# Patient Record
Sex: Female | Born: 1990 | Race: White | Hispanic: No | Marital: Married | Smoking: Never smoker
Health system: Southern US, Community
[De-identification: ages and names within clinical notes are randomized; demographics above are authoritative.]

## PROBLEM LIST (undated history)

## (undated) DIAGNOSIS — O99280 Endocrine, nutritional and metabolic diseases complicating pregnancy, unspecified trimester: Secondary | ICD-10-CM

## (undated) DIAGNOSIS — Z789 Other specified health status: Secondary | ICD-10-CM

## (undated) DIAGNOSIS — D219 Benign neoplasm of connective and other soft tissue, unspecified: Secondary | ICD-10-CM

## (undated) DIAGNOSIS — E7212 Methylenetetrahydrofolate reductase deficiency: Secondary | ICD-10-CM

---
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
## 2009-01-09 ENCOUNTER — Encounter: Payer: Self-pay | Admitting: Cardiovascular Disease

## 2009-07-03 ENCOUNTER — Encounter: Payer: Self-pay | Admitting: Cardiovascular Disease

## 2011-07-15 HISTORY — PX: DILATION AND CURETTAGE OF UTERUS: SHX78

## 2011-07-15 NOTE — L&D Delivery Note (Signed)
Delivery Note At 11:09 AM a non-viable female was delivered via Vaginal, Spontaneous Delivery (Presentation: Left Occiput Anterior).    Placenta status: Intact, Manual removal.  Cord: 2 vessels with the following complications: tight nuchal cord x 2 with a true knot in the cord and a loop of cord wrapped around the right arm. I believe the cause of this IUFD was secondary to the cord and was a cord accident  Anesthesia:  epidural Episiotomy: none Lacerations: none Suture Repair: not necessary Est. Blood Loss (mL): 100  Mom to to stay here for recovery.  Baby to NA.  Kaylise Blakeley L 12/24/2011, 11:24 AM

## 2011-08-05 LAB — OB RESULTS CONSOLE HIV ANTIBODY (ROUTINE TESTING): HIV: NONREACTIVE

## 2011-08-05 LAB — OB RESULTS CONSOLE GC/CHLAMYDIA: Gonorrhea: NEGATIVE

## 2011-08-05 LAB — OB RESULTS CONSOLE ABO/RH

## 2011-08-05 LAB — OB RESULTS CONSOLE HEPATITIS B SURFACE ANTIGEN: Hepatitis B Surface Ag: NEGATIVE

## 2011-08-05 LAB — OB RESULTS CONSOLE RUBELLA ANTIBODY, IGM: Rubella: IMMUNE

## 2011-09-23 ENCOUNTER — Other Ambulatory Visit: Payer: Self-pay

## 2011-09-26 ENCOUNTER — Other Ambulatory Visit (HOSPITAL_COMMUNITY): Payer: Self-pay | Admitting: Obstetrics and Gynecology

## 2011-09-26 DIAGNOSIS — O2690 Pregnancy related conditions, unspecified, unspecified trimester: Secondary | ICD-10-CM

## 2011-09-26 DIAGNOSIS — Z3689 Encounter for other specified antenatal screening: Secondary | ICD-10-CM

## 2011-09-30 ENCOUNTER — Ambulatory Visit (HOSPITAL_COMMUNITY)
Admission: RE | Admit: 2011-09-30 | Discharge: 2011-09-30 | Disposition: A | Discharge status: Home / Self Care | Payer: BC Managed Care – PPO | Source: Ambulatory Visit | Attending: Obstetrics and Gynecology | Admitting: Obstetrics and Gynecology

## 2011-09-30 DIAGNOSIS — IMO0001 Reserved for inherently not codable concepts without codable children: Secondary | ICD-10-CM | POA: Insufficient documentation

## 2011-09-30 DIAGNOSIS — Z363 Encounter for antenatal screening for malformations: Secondary | ICD-10-CM | POA: Insufficient documentation

## 2011-09-30 DIAGNOSIS — Z1389 Encounter for screening for other disorder: Secondary | ICD-10-CM | POA: Insufficient documentation

## 2011-09-30 DIAGNOSIS — O2690 Pregnancy related conditions, unspecified, unspecified trimester: Secondary | ICD-10-CM

## 2011-09-30 DIAGNOSIS — O358XX0 Maternal care for other (suspected) fetal abnormality and damage, not applicable or unspecified: Secondary | ICD-10-CM | POA: Insufficient documentation

## 2011-09-30 DIAGNOSIS — Z3689 Encounter for other specified antenatal screening: Secondary | ICD-10-CM

## 2011-12-23 ENCOUNTER — Inpatient Hospital Stay (HOSPITAL_COMMUNITY)
Admission: AD | Admit: 2011-12-23 | Discharge: 2011-12-24 | DRG: 372 | Disposition: A | Discharge status: Home / Self Care | Payer: BC Managed Care – PPO | Source: Ambulatory Visit | Attending: Obstetrics and Gynecology | Admitting: Obstetrics and Gynecology

## 2011-12-23 ENCOUNTER — Encounter (HOSPITAL_COMMUNITY): Payer: Self-pay | Admitting: *Deleted

## 2011-12-23 DIAGNOSIS — O364XX0 Maternal care for intrauterine death, not applicable or unspecified: Principal | ICD-10-CM | POA: Diagnosis present

## 2011-12-23 HISTORY — DX: Other specified health status: Z78.9

## 2011-12-23 LAB — CBC
HCT: 31.5 % — ABNORMAL LOW (ref 36.0–46.0)
Hemoglobin: 11 g/dL — ABNORMAL LOW (ref 12.0–15.0)
MCH: 30.1 pg (ref 26.0–34.0)
MCHC: 34.9 g/dL (ref 30.0–36.0)
RDW: 13.3 % (ref 11.5–15.5)

## 2011-12-23 MED ORDER — ONDANSETRON HCL 4 MG/2ML IJ SOLN
4.0000 mg | Freq: Four times a day (QID) | INTRAMUSCULAR | Status: DC | PRN
  Administered 2011-12-24: 4 mg via INTRAVENOUS
  Filled 2011-12-23: qty 2

## 2011-12-23 MED ORDER — FENTANYL 2.5 MCG/ML BUPIVACAINE 1/10 % EPIDURAL INFUSION (WH - ANES)
14.0000 mL/h | INTRAMUSCULAR | Status: DC
  Administered 2011-12-24 (×2): 14 mL/h via EPIDURAL
  Filled 2011-12-23 (×3): qty 60

## 2011-12-23 MED ORDER — OXYTOCIN BOLUS FROM INFUSION
500.0000 mL | Freq: Once | INTRAVENOUS | Status: DC
  Filled 2011-12-23: qty 500
  Filled 2011-12-23: qty 1000

## 2011-12-23 MED ORDER — EPHEDRINE 5 MG/ML INJ: 10.0000 mg | INTRAVENOUS | Status: DC | PRN

## 2011-12-23 MED ORDER — MISOPROSTOL 200 MCG PO TABS
200.0000 ug | ORAL_TABLET | Freq: Four times a day (QID) | ORAL | Status: DC | PRN
  Administered 2011-12-23 – 2011-12-24 (×2): 200 ug via VAGINAL
  Filled 2011-12-23 (×2): qty 1

## 2011-12-23 MED ORDER — LACTATED RINGERS IV SOLN: 500.0000 mL | INTRAVENOUS | Status: DC | PRN

## 2011-12-23 MED ORDER — BUTORPHANOL TARTRATE 2 MG/ML IJ SOLN
1.0000 mg | INTRAMUSCULAR | Status: DC | PRN
  Administered 2011-12-23 – 2011-12-24 (×3): 1 mg via INTRAVENOUS
  Filled 2011-12-23 (×4): qty 1

## 2011-12-23 MED ORDER — IBUPROFEN 600 MG PO TABS
600.0000 mg | ORAL_TABLET | Freq: Four times a day (QID) | ORAL | Status: DC | PRN
  Filled 2011-12-23: qty 1

## 2011-12-23 MED ORDER — LIDOCAINE HCL (PF) 1 % IJ SOLN: 30.0000 mL | INTRAMUSCULAR | Status: DC | PRN

## 2011-12-23 MED ORDER — LACTATED RINGERS IV SOLN
500.0000 mL | Freq: Once | INTRAVENOUS | Status: AC
  Administered 2011-12-24: 500 mL via INTRAVENOUS

## 2011-12-23 MED ORDER — DIPHENHYDRAMINE HCL 50 MG/ML IJ SOLN: 12.5000 mg | INTRAMUSCULAR | Status: DC | PRN

## 2011-12-23 MED ORDER — CITRIC ACID-SODIUM CITRATE 334-500 MG/5ML PO SOLN: 30.0000 mL | ORAL | Status: DC | PRN

## 2011-12-23 MED ORDER — PHENYLEPHRINE 40 MCG/ML (10ML) SYRINGE FOR IV PUSH (FOR BLOOD PRESSURE SUPPORT)
80.0000 ug | PREFILLED_SYRINGE | INTRAVENOUS | Status: DC | PRN
  Filled 2011-12-23: qty 5

## 2011-12-23 MED ORDER — PHENYLEPHRINE 40 MCG/ML (10ML) SYRINGE FOR IV PUSH (FOR BLOOD PRESSURE SUPPORT)
80.0000 ug | PREFILLED_SYRINGE | INTRAVENOUS | Status: DC | PRN
Start: 2011-12-23 — End: 2011-12-24

## 2011-12-23 MED ORDER — ACETAMINOPHEN 325 MG PO TABS: 650.0000 mg | ORAL_TABLET | ORAL | Status: DC | PRN

## 2011-12-23 MED ORDER — OXYTOCIN 20 UNITS IN LACTATED RINGERS INFUSION - SIMPLE
125.0000 mL/h | Freq: Once | INTRAVENOUS | Status: DC
  Administered 2011-12-24: 500 mL/h via INTRAVENOUS

## 2011-12-23 MED ORDER — EPHEDRINE 5 MG/ML INJ
10.0000 mg | INTRAVENOUS | Status: DC | PRN
  Filled 2011-12-23: qty 4

## 2011-12-23 MED ORDER — OXYCODONE-ACETAMINOPHEN 5-325 MG PO TABS
1.0000 | ORAL_TABLET | ORAL | Status: DC | PRN
  Administered 2011-12-24: 1 via ORAL
  Filled 2011-12-23 (×2): qty 1

## 2011-12-23 MED ORDER — FLEET ENEMA 7-19 GM/118ML RE ENEM: 1.0000 | ENEMA | RECTAL | Status: DC | PRN

## 2011-12-23 MED ORDER — LACTATED RINGERS IV SOLN
INTRAVENOUS | Status: DC
  Administered 2011-12-23: 125 mL/h via INTRAVENOUS
  Administered 2011-12-24: 09:00:00 via INTRAVENOUS

## 2011-12-23 NOTE — H&P (Signed)
Chloe Chavez is a 21 y.o. female at 37 weeks with intrauterine fetal demise. He has a 2 vessel cord. Ultrasounds of otherwise been unremarkable. 2 seen the office on June 10 with 3 decreased fetal activity. No fetal heart tones were noted. Ultrasound confirmed intrauterine fetal demise. He should submit for Cytotec induction for intrauterine fetal demise. Ultrasound is performed in labor and delivery. No fetal heart activity is noted. Maternal Medical History:  Prenatal Complications - Diabetes: none.    OB History    Grav Para Term Preterm Abortions TAB SAB Ect Mult Living   2 1 1  0 0 0 0 0 0 1     Past Medical History  Diagnosis Date  . No pertinent past medical history    Past Surgical History  Procedure Date  . Breast surgery 2006    Cyst removal right breat   Family History: family history is not on file. Social History:  reports that she has never smoked. She has never used smokeless tobacco. She reports that she does not drink alcohol or use illicit drugs.  Review of Systems  All other systems reviewed and are negative.      Blood pressure 107/66, pulse 99, temperature 98.3 F (36.8 C), temperature source Oral, height 5\' 4"  (1.626 m), weight 72.122 kg (159 lb). Maternal Exam:  Abdomen: Fundal height is c/w dates.   Fetal presentation: vertex  Pelvis: adequate for delivery.   Cervix: Cervix evaluated by digital exam.     Physical Exam  Cardiovascular: Normal rate, regular rhythm and normal heart sounds.   Respiratory: Effort normal and breath sounds normal.  GI:       Gravid uterus neg FHT  Neurological: She has normal reflexes.    Prenatal labs: ABO, Rh: O/Positive/-- (01/22 0000) Antibody: Negative (01/22 0000) Rubella: Immune (01/22 0000) RPR: Nonreactive (01/22 0000)  HBsAg: Negative (01/22 0000)  HIV: Non-reactive (01/22 0000)  GBS: Positive (01/25 0000)   Assessment/Plan: Demise at 31 weeks. 2 vessel cord.  Patient undergo Cytotec induction  for intrauterine fetal demise. Ashby Dawes of the process risk and benefits have been discussed. All questions are answered.  Chloe Chavez 12/23/2011, 7:44 PM

## 2011-12-24 ENCOUNTER — Encounter (HOSPITAL_COMMUNITY): Payer: Self-pay | Admitting: Anesthesiology

## 2011-12-24 ENCOUNTER — Encounter (HOSPITAL_COMMUNITY): Payer: Self-pay | Admitting: *Deleted

## 2011-12-24 ENCOUNTER — Inpatient Hospital Stay (HOSPITAL_COMMUNITY): Payer: BC Managed Care – PPO | Admitting: Anesthesiology

## 2011-12-24 MED ORDER — MEASLES, MUMPS & RUBELLA VAC ~~LOC~~ INJ
0.5000 mL | INJECTION | Freq: Once | SUBCUTANEOUS | Status: DC
  Filled 2011-12-24: qty 0.5

## 2011-12-24 MED ORDER — WITCH HAZEL-GLYCERIN EX PADS: 1.0000 "application " | MEDICATED_PAD | CUTANEOUS | Status: DC | PRN

## 2011-12-24 MED ORDER — FENTANYL 2.5 MCG/ML BUPIVACAINE 1/10 % EPIDURAL INFUSION (WH - ANES)
INTRAMUSCULAR | Status: DC | PRN
  Administered 2011-12-24: 14 mL/h via EPIDURAL

## 2011-12-24 MED ORDER — IBUPROFEN 600 MG PO TABS: 600.0000 mg | ORAL_TABLET | Freq: Four times a day (QID) | ORAL | Status: AC | PRN

## 2011-12-24 MED ORDER — OXYCODONE-ACETAMINOPHEN 5-325 MG PO TABS: 1.0000 | ORAL_TABLET | ORAL | Status: AC | PRN

## 2011-12-24 MED ORDER — IBUPROFEN 600 MG PO TABS
600.0000 mg | ORAL_TABLET | Freq: Four times a day (QID) | ORAL | Status: DC
  Administered 2011-12-24: 600 mg via ORAL

## 2011-12-24 MED ORDER — MEDROXYPROGESTERONE ACETATE 150 MG/ML IM SUSP: 150.0000 mg | INTRAMUSCULAR | Status: DC | PRN

## 2011-12-24 MED ORDER — FLEET ENEMA 7-19 GM/118ML RE ENEM: 1.0000 | ENEMA | Freq: Every day | RECTAL | Status: DC | PRN

## 2011-12-24 MED ORDER — ONDANSETRON HCL 4 MG/2ML IJ SOLN: 4.0000 mg | INTRAMUSCULAR | Status: DC | PRN

## 2011-12-24 MED ORDER — TETANUS-DIPHTH-ACELL PERTUSSIS 5-2.5-18.5 LF-MCG/0.5 IM SUSP
0.5000 mL | Freq: Once | INTRAMUSCULAR | Status: DC
  Filled 2011-12-24: qty 0.5

## 2011-12-24 MED ORDER — OXYCODONE-ACETAMINOPHEN 5-325 MG PO TABS: 1.0000 | ORAL_TABLET | ORAL | Status: DC | PRN

## 2011-12-24 MED ORDER — LIDOCAINE HCL (PF) 1 % IJ SOLN
INTRAMUSCULAR | Status: DC | PRN
  Administered 2011-12-24 (×2): 8 mL

## 2011-12-24 MED ORDER — BISACODYL 10 MG RE SUPP
10.0000 mg | Freq: Every day | RECTAL | Status: DC | PRN
  Filled 2011-12-24: qty 1

## 2011-12-24 MED ORDER — SENNOSIDES-DOCUSATE SODIUM 8.6-50 MG PO TABS: 2.0000 | ORAL_TABLET | Freq: Every day | ORAL | Status: DC

## 2011-12-24 MED ORDER — DIPHENHYDRAMINE HCL 25 MG PO CAPS: 25.0000 mg | ORAL_CAPSULE | Freq: Four times a day (QID) | ORAL | Status: DC | PRN

## 2011-12-24 MED ORDER — ZOLPIDEM TARTRATE 5 MG PO TABS: 5.0000 mg | ORAL_TABLET | Freq: Every evening | ORAL | Status: DC | PRN

## 2011-12-24 MED ORDER — BENZOCAINE-MENTHOL 20-0.5 % EX AERO
1.0000 "application " | INHALATION_SPRAY | CUTANEOUS | Status: DC | PRN
  Filled 2011-12-24: qty 56

## 2011-12-24 MED ORDER — ONDANSETRON HCL 4 MG PO TABS: 4.0000 mg | ORAL_TABLET | ORAL | Status: DC | PRN

## 2011-12-24 MED ORDER — SIMETHICONE 80 MG PO CHEW: 80.0000 mg | CHEWABLE_TABLET | ORAL | Status: DC | PRN

## 2011-12-24 MED ORDER — DIBUCAINE 1 % RE OINT
1.0000 "application " | TOPICAL_OINTMENT | RECTAL | Status: DC | PRN
  Filled 2011-12-24: qty 28

## 2011-12-24 NOTE — Anesthesia Preprocedure Evaluation (Signed)
Anesthesia Evaluation  Patient identified by MRN, date of birth, ID band Patient awake    Reviewed: Allergy & Precautions, H&P , NPO status , Patient's Chart, lab work & pertinent test results  Airway Mallampati: I TM Distance: >3 FB Neck ROM: full    Dental No notable dental hx.    Pulmonary neg pulmonary ROS,  breath sounds clear to auscultation  Pulmonary exam normal       Cardiovascular negative cardio ROS      Neuro/Psych negative neurological ROS  negative psych ROS   GI/Hepatic negative GI ROS, Neg liver ROS,   Endo/Other  negative endocrine ROS  Renal/GU negative Renal ROS  negative genitourinary   Musculoskeletal negative musculoskeletal ROS (+)   Abdominal Normal abdominal exam  (+)   Peds negative pediatric ROS (+)  Hematology negative hematology ROS (+)   Anesthesia Other Findings   Reproductive/Obstetrics (+) Pregnancy                           Anesthesia Physical Anesthesia Plan  ASA: II  Anesthesia Plan: Epidural   Post-op Pain Management:    Induction:   Airway Management Planned:   Additional Equipment:   Intra-op Plan:   Post-operative Plan:   Informed Consent: I have reviewed the patients History and Physical, chart, labs and discussed the procedure including the risks, benefits and alternatives for the proposed anesthesia with the patient or authorized representative who has indicated his/her understanding and acceptance.     Plan Discussed with:   Anesthesia Plan Comments:         Anesthesia Quick Evaluation  

## 2011-12-24 NOTE — Progress Notes (Signed)
Post discharge chart review completed.  

## 2011-12-24 NOTE — Progress Notes (Signed)
Chaplain Note: Received Page at 1:09am (June 12).  Chavez had requested to see Chloe Chaplain.  Earlier, Chloe nurse paged at 7:17pm (June 11) to alert me that Chloe Chavez was going to deliver sometime in Chloe morning.  Chaplain urged her to call at *anytime* if she felt it was necessary or Chloe Chavez or family requested it.  Nurse was appreciative of Chloe availability and willingness.  [facility] (at 1:41am), Chloe Chavez's family (mother, father, close to 50, and older sister by ~8years, and her husband) were present.  Husband's name is Fayrene Fearing (about 24).  Chaplain spent time with Chloe family as Chloe Chavez (almost 21) was asleep.  This is Chloe Chavez's second child, as they have a three year old daughter.  We talked about Chloe different roles and contributions of each of Chloe family members.  We discussed Chloe range of feelings involved and Chloe great love, and devotion they were showing.  We prayed together, all Chloe while Chloe Chavez was still sleeping.  Chloe husband shared Chloe baby's name as Eli.  Chloe Chaplain left when "medication time" approached and said he would return when after Chloe nurse came in.  Chloe Chaplain returned some 15 minutes later (having already been with Chloe family for about 20 minutes).  Chloe Chavez was awake and conversant.  We discussed that there is "no fault" attached to this situation.  Chloe Chavez and husband were concerned about why this happened.  We discussed having "no answers" and having to live with that.  Also talked about how Chloe baby had already impacted their lives, shaping their schedules.  Chloe husband volunteered that they had become closer to each other through Chloe pregnancy and through this particular current aspect of it.  Chavez also shared that she was scared "a little."  We discussed that Chloe process was "Chloe same" though admittedly Chloe outcome would be a bit different.  She voiced concern about Chloe funeral arrangements and her husband  assured her it was taken care of.  She was comforted by his response.  Chaplain also handed Chloe husband a letter written to him by a friend on Chloe 30th anniversary of Chloe demise of Chloe friend's twins.  It is a letter of hope and belief, and actually provides some insight into Chloe long haul to follow and how Chloe baby will stay with them.  Chloe Chaplain also described how Chloe baby might look when it is delivered, in its frailty and form.  This seemed to lift some of Chloe apprehension.  He affirmed Chloe Chavez's husband for his response to his young wife.  He encouraged him to Chloe "chores" ahead that will be helpful for her and to treat her like "Chloe queen she is."  Chavez liked that reference.  Chloe husband, at this time, while receptive, seems to be acting like his wife is sick as opposed to losing his baby.  And we discussed Chloe difference in attachment between him and his wife with reference to [employer].  Overall, Chloe family seemed appreciative of Chloe  Chaplain's presence.  Chloe Chaplain prayed again wit Chloe Chavez and her husband.  He assured them that he is available to come again and that Chloe day Chaplain(s) would check in with them when they arrived in Chloe morning.  Chloe older sister is Catholic.  Chloe Chavez's parents are of a Pentecostal strain, and Chloe Chavez and husband were referred to by Chloe Chavez's mother as "non-denominational."  While they  had no issue with prayer, they didn't have a pastor, which led me to believe they are not church attenders, along with Chloe mother's way of referring to them as non-denominational.  [name], Chaplain Group Pager: [phone] Local Pager: [phone]

## 2011-12-24 NOTE — Progress Notes (Signed)
12/24/11 1000  Clinical Encounter Type  Visited With Patient and family together  Visit Type Follow-up;Spiritual support;Social support  Spiritual Encounters  Spiritual Needs Emotional;Grief support  Stress Factors  Patient Stress Factors (Loss @31  weeks)    Made brief personal introduction to offer continued support following Chaplain Richard Gross's care last night.  Pt was resting, but she and family appreciated visit; will follow up later today.  [address] [place], [place] [phone]

## 2011-12-24 NOTE — Progress Notes (Signed)
Patient is comfortable with the epidural. Cervix is 80%/3 cm -1 AROM Clear fluid  IMPRESSION: IUFD 2 vessel cord  PLAN: Possibly start pitocin Plan of care discussed with patient

## 2011-12-24 NOTE — Anesthesia Procedure Notes (Signed)
Epidural Patient location during procedure: OB Start time: 12/24/2011 2:54 AM End time: 12/24/2011 2:58 AM Reason for block: procedure for pain  Staffing Anesthesiologist: Sandrea Hughs Performed by: anesthesiologist   Preanesthetic Checklist Completed: patient identified, site marked, surgical consent, pre-op evaluation, timeout performed, IV checked, risks and benefits discussed and monitors and equipment checked  Epidural Patient position: sitting Prep: site prepped and draped and DuraPrep Patient monitoring: continuous pulse ox and blood pressure Approach: midline Injection technique: LOR air  Needle:  Needle type: Tuohy  Needle gauge: 17 G Needle length: 9 cm Needle insertion depth: 5 cm cm Catheter type: closed end flexible Catheter size: 19 Gauge Catheter at skin depth: 10 cm Test dose: negative and Other  Assessment Sensory level: T9 Events: blood not aspirated, injection not painful, no injection resistance, negative IV test and no paresthesia

## 2011-12-24 NOTE — Discharge Summary (Signed)
Admission Diagnosis: IUFD 2 vessel cord  Discharge Diagnosis: Same  Hospital course: 21 year old G 2 P 1101 at 50 w 1 days presents for induction secondary to IUFD. Pregnancy complicated by 2 vessel cord. She was given Cytotec on admission and received an epidural. She delivered vaginally a female fetus. There was a tight nuchal cord x 2 with a true knot in the cord. That was most likely the cause of the IUFD. She desired to go home later on day of delivery. She was given Ibuprofen and Percocet for her pain. She will follow up with Dr. Vincente Poli in 2 weeks She was advised to cal if fever, heavy bleeding or severe abdominal pain.

## 2011-12-24 NOTE — Progress Notes (Signed)
After RN reviewed discharge instructions and gave prescription to patient, the patient was wheeled off the unit. Patient had no complaints at time of discharge. She left the campus in a private vehicle with her mom and husband.

## 2011-12-24 NOTE — Progress Notes (Signed)
12/24/11 1245  Clinical Encounter Type  Visited With Patient and family together  Visit Type Follow-up;Spiritual support  Spiritual Encounters  Spiritual Needs Emotional;Grief support    Made follow-up visit to provide further spiritual/emotional/grief support.  Chloe Chavez and Chloe Chavez were calm, tearful, appreciative.  Sister Chloe Chavez is serving as family coordinator and spokesperson/liaison with Chloe Chavez to organize visits and support.  Chloe Chavez shared that the timing and experience of this loss are particularly loaded because her own birthday is tomorrow, and she was a preemie delivered in an emergency situation that almost cost Chloe Chavez's life and their mom's life.  Naming the additional family stress has seemed helpful and constructive as family grieves together.  Provided blessing at bedside and prayer in waiting room for family, per pt request.  Provided pastoral presence and listening, and assisted sister Chloe Chavez with grief education tools as she navigates support relationships in her family.  Please page as needed for further support.    [address] [place], [place] [phone]

## 2011-12-25 NOTE — Anesthesia Postprocedure Evaluation (Signed)
Anesthesia Post Note  Patient: Chloe Chavez  Procedure(s) Performed: * No procedures listed *  Anesthesia type: Epidural  Patient location: Mother/Baby  Post pain: Pain level controlled  Post assessment: Post-op Vital signs reviewed  Last Vitals: There were no vitals filed for this visit.  Post vital signs: Reviewed  Level of consciousness: awake  Complications: No apparent anesthesia complications

## 2011-12-26 LAB — TORCH-IGM(TOXO/ RUB/ CMV/ HSV) W TITER
CMV IgM: 3.8 AI — ABNORMAL HIGH
HSV 2 IgM Abs: NEGATIVE
RPR Screen: NONREACTIVE

## 2011-12-26 LAB — HSV 1 IGM, IFA (REFLEX): HSV 1 IgM ab, IFA: 1:40 {titer} — AB

## 2012-09-26 IMAGING — US US OB DETAIL+14 WK
1 series · 14 of 28 positions shown · non-contrast
Comparison: none

[Series 1: us ob detail+14 wk · 0.19mm/px · 14 of 102 slices shown]
[im 4/102]
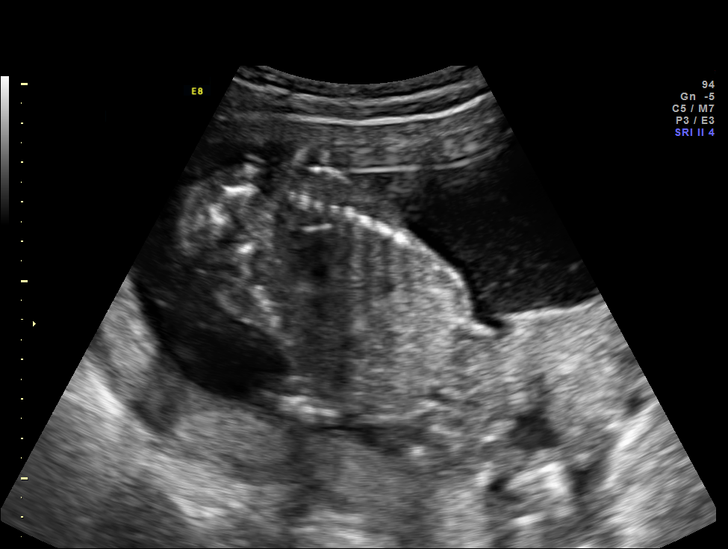
[im 12/102]
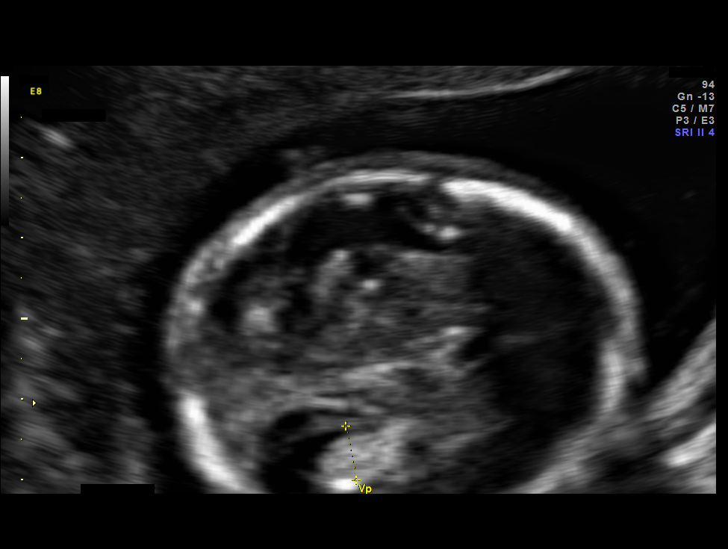
[im 19/102]
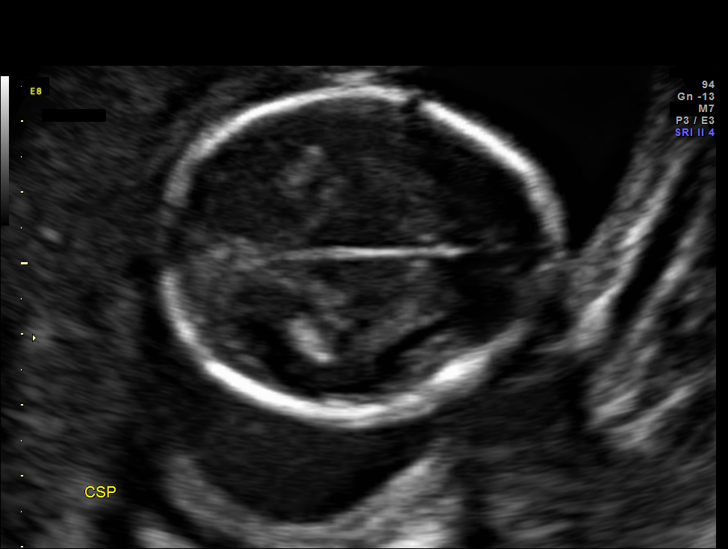
[im 27/102]
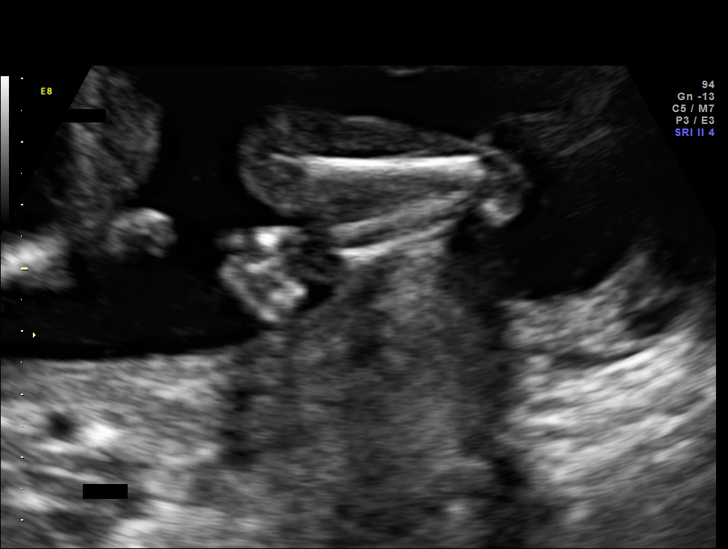
[im 34/102]
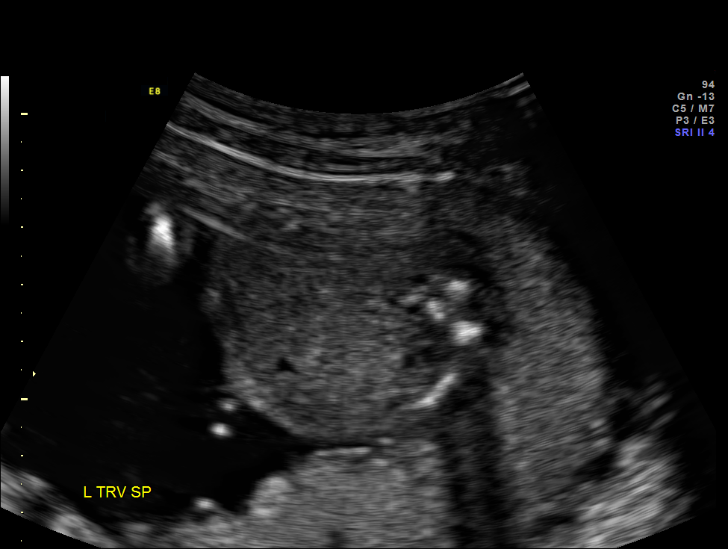
[im 42/102]
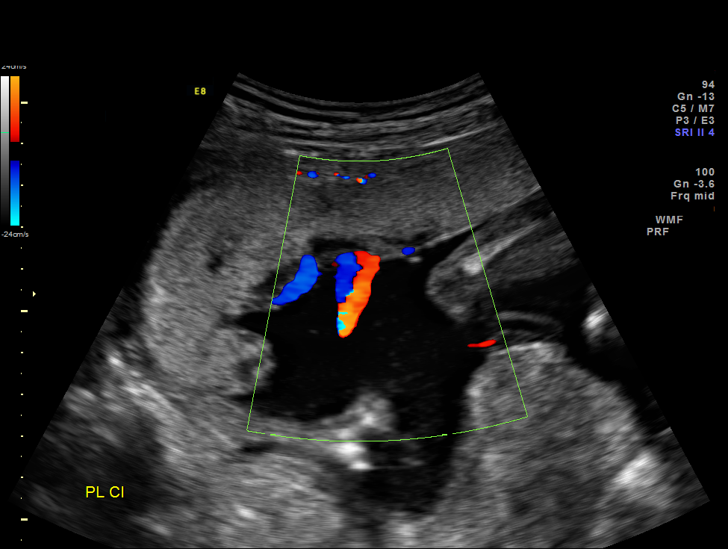
[im 49/102]
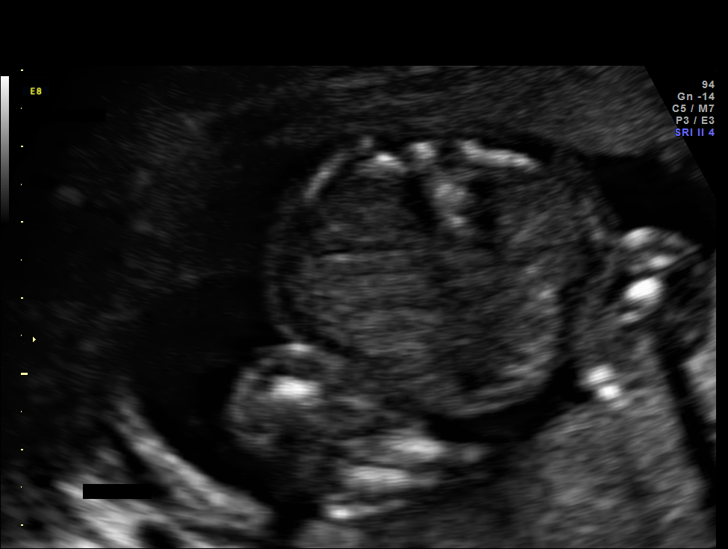
[im 57/102]
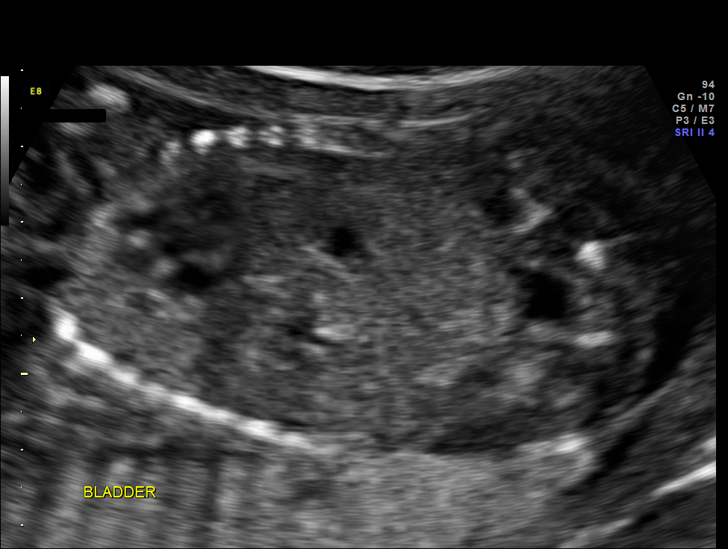
[im 64/102]
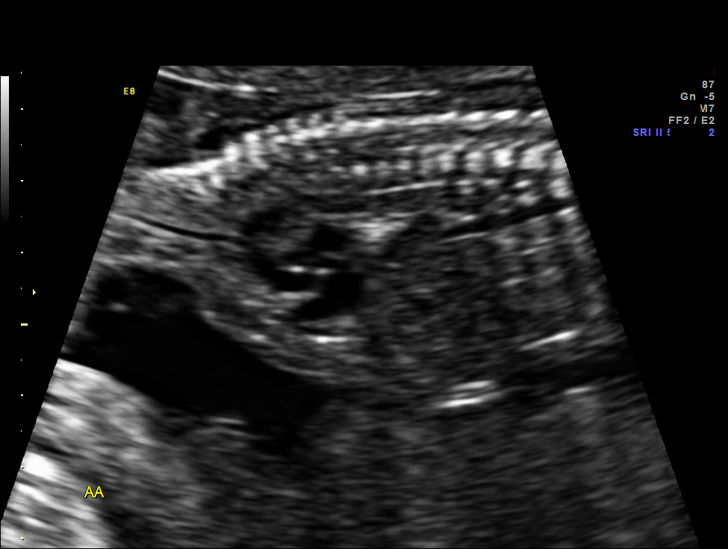
[im 72/102]
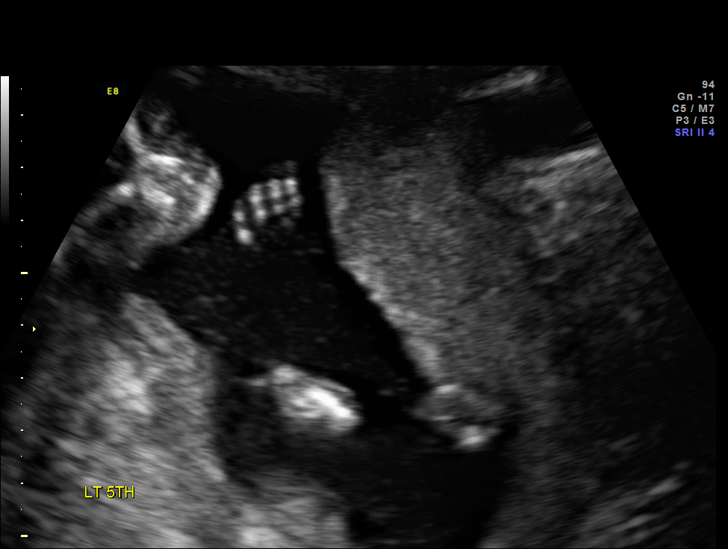
[im 79/102]
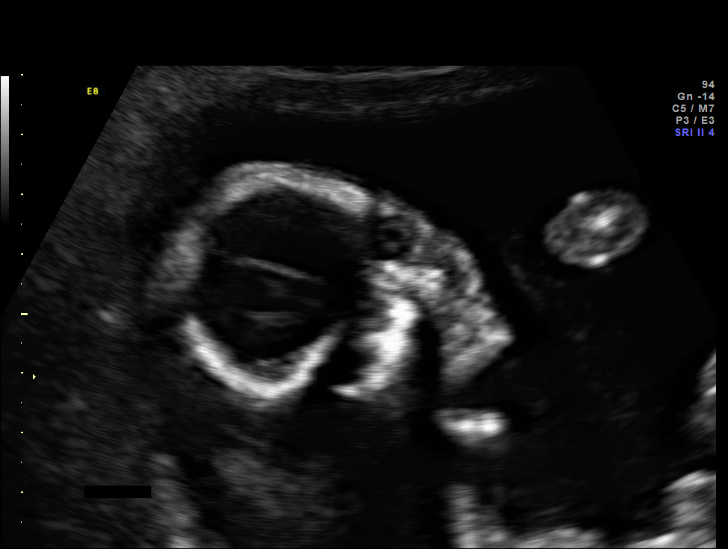
[im 87/102]
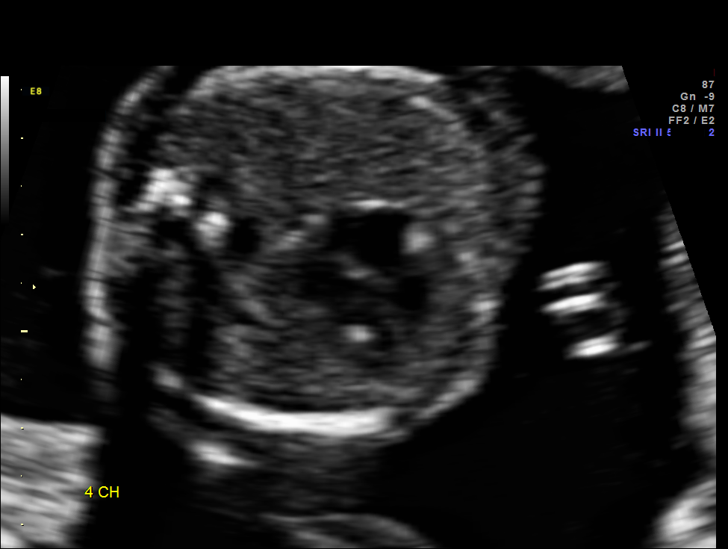
[im 94/102]
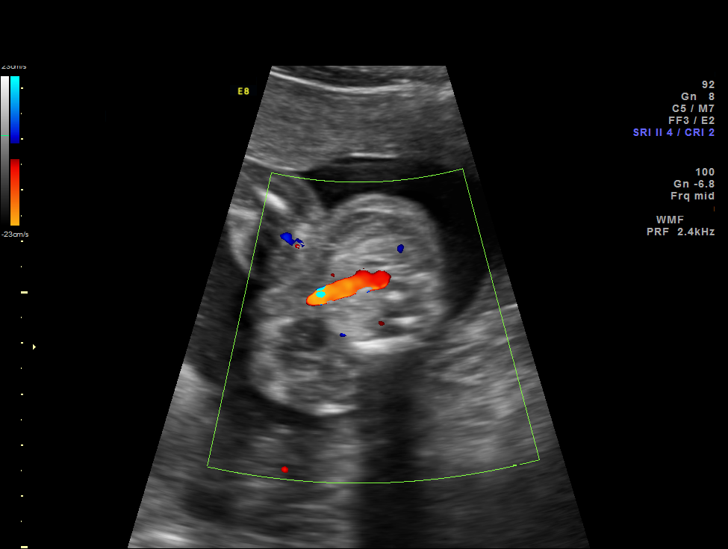
[im 102/102]
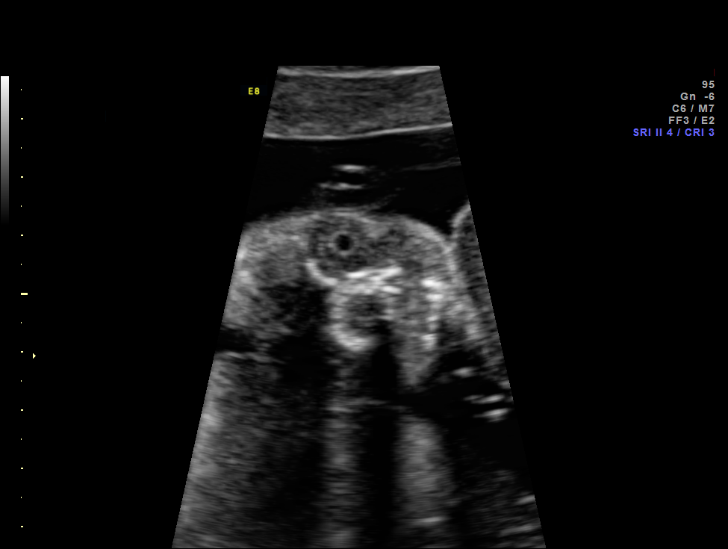

[14 of 28 positions shown; findings below may reference images not displayed]

Canned report from images found in remote index.

Refer to host system for actual result text.

## 2014-04-21 ENCOUNTER — Other Ambulatory Visit: Payer: Self-pay | Admitting: Obstetrics and Gynecology

## 2014-04-24 LAB — CYTOLOGY - PAP

## 2014-05-09 ENCOUNTER — Ambulatory Visit (HOSPITAL_COMMUNITY): Payer: BC Managed Care – PPO

## 2014-05-10 ENCOUNTER — Encounter: Payer: Self-pay | Admitting: Hematology & Oncology

## 2014-05-15 ENCOUNTER — Encounter (HOSPITAL_COMMUNITY): Payer: Self-pay | Admitting: *Deleted

## 2014-05-16 ENCOUNTER — Telehealth: Payer: Self-pay | Admitting: Hematology & Oncology

## 2014-05-16 NOTE — Telephone Encounter (Signed)
I spoke w NEW PATIENT today to remind them of their appointment with Dr. Ennever. Also, advised them to bring all medication bottles and insurance card information. ° °

## 2014-05-17 ENCOUNTER — Ambulatory Visit (HOSPITAL_BASED_OUTPATIENT_CLINIC_OR_DEPARTMENT_OTHER): Payer: BC Managed Care – PPO | Admitting: Family

## 2014-05-17 ENCOUNTER — Encounter: Payer: Self-pay | Admitting: Family

## 2014-05-17 ENCOUNTER — Other Ambulatory Visit: Payer: BC Managed Care – PPO | Admitting: Lab

## 2014-05-17 ENCOUNTER — Ambulatory Visit: Payer: BC Managed Care – PPO

## 2014-05-17 VITALS — BP 113/69 | HR 83 | Temp 97.5°F | Resp 16 | Ht 64.0 in | Wt 139.0 lb

## 2014-05-17 DIAGNOSIS — E5111 Dry beriberi: Secondary | ICD-10-CM

## 2014-05-17 DIAGNOSIS — D841 Defects in the complement system: Secondary | ICD-10-CM

## 2014-05-17 DIAGNOSIS — D699 Hemorrhagic condition, unspecified: Secondary | ICD-10-CM

## 2014-05-17 DIAGNOSIS — Z800 Family history of malignant neoplasm of digestive organs: Secondary | ICD-10-CM

## 2014-05-17 DIAGNOSIS — Z803 Family history of malignant neoplasm of breast: Secondary | ICD-10-CM

## 2014-05-17 DIAGNOSIS — D689 Coagulation defect, unspecified: Secondary | ICD-10-CM

## 2014-05-17 DIAGNOSIS — E728 Other specified disorders of amino-acid metabolism: Secondary | ICD-10-CM

## 2014-05-18 ENCOUNTER — Telehealth: Payer: Self-pay | Admitting: Hematology & Oncology

## 2014-05-18 NOTE — Telephone Encounter (Signed)
Left pt message to call for appointment. ( she needs fasting lab)

## 2014-05-18 NOTE — Progress Notes (Signed)
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 She has a strong family history of cancer (myeloma, breast, prostate, pancreatic, rectal, colon). Her sister is positive for the MTHFR a1298c gene. The women in her family also have very heavy cycles, many miscarriages and several had to have hysterectomys in their 20's including her sister. She has heavy cycles and clotting. The clotting has gotten better since her D&C. She has one living child that is 31 years old and she is healthy. She had a still birth at 3 weeks in 2013. There was a 2 vessel cord but she does not know if there were clots in the placenta. A week after her stillbirth she hemorrhaged and had to be transfused. A piece of placenta was missed during the D&C. She states that she bruises easily. She is not on birth control. She tried the patch but within a week of putting it on it caused her left leg to go numb so she stopped it. She does not smoke or drink alcohol. Her sister and daughter both have very severe allergies that have caused angioedema and anaphylactic shock in the past. She has been living in [place] for 3 years. She and her husband are originally from [place], [place]. She is a Writer.  She denies fever, chills, n/v, cough, rash, headache, dizziness, SOB, chest pain, palpitations, abdominal pain, constipation, diarrhea, blood in urine or stool. No swelling, tenderness, numbness or tingling in her extremities. Her appetite is good and she is staying hydrated. Her weight is stable. No bleeding or pain.   ROS: All other 10 point review of systems is negative.   PAST MEDICAL  HISTORY:   Past Medical History  Diagnosis Date  . No pertinent past medical history     ALLERGIES: Allergies  Allergen Reactions  . Augmentin [Amoxicillin-Pot Clavulanate] Hives and Swelling  . Ceclor [Cefaclor] Hives and Swelling  . Penicillins Hives and Swelling      MEDICATIONS:  No current outpatient prescriptions on file prior to visit.   No current facility-administered medications on file prior to visit.     PAST SURGICAL HISTORY Past Surgical History  Procedure Laterality Date  . Breast surgery  2006    Cyst removal right breat    FAMILY HISTORY: No family history on file.  SOCIAL HISTORY:  reports that she has never smoked. She has never used smokeless tobacco. She reports that she does not drink alcohol or use illicit drugs.  PERFORMANCE STATUS: The patient's performance status is 0 - Asymptomatic  PHYSICAL EXAM: Most Recent Vital Signs: Blood pressure 113/69, pulse 83, temperature 97.5 F (36.4 C), temperature source Oral, resp. rate 16, height 5\' 4"  (1.626 m), weight 139 lb (63.05 kg), last menstrual period 04/24/2014, unknown if currently breastfeeding. BP 113/69 mmHg  Pulse 83  Temp(Src) 97.5 F (36.4 C) (Oral)  Resp 16  Ht 5\' 4"  (1.626 m)  Wt 139 lb (63.05 kg)  BMI 23.85 kg/m2  LMP 04/24/2014  General Appearance:    Alert, cooperative, no distress, appears stated age  Head:    Normocephalic, without obvious abnormality, atraumatic  Eyes:  PERRL, conjunctiva/corneas clear, EOM's intact, fundi    benign, both eyes        Throat:   Lips, mucosa, and tongue normal; teeth and gums normal  Neck:   Supple, symmetrical, trachea midline, no adenopathy;    thyroid:  no enlargement/tenderness/nodules; no carotid   bruit or JVD  Back:     Symmetric, no curvature, ROM normal, no CVA tenderness  Lungs:     Clear to auscultation bilaterally, respirations unlabored  Chest Wall:    No tenderness or deformity   Heart:    Regular rate and rhythm, S1 and S2  normal, no murmur, rub   or gallop     Abdomen:     Soft, non-tender, bowel sounds active all four quadrants,    no masses, no organomegaly        Extremities:   Extremities normal, atraumatic, no cyanosis or edema  Pulses:   2+ and symmetric all extremities  Skin:   Skin color, texture, turgor normal, no rashes or lesions  Lymph nodes:   Cervical, supraclavicular, and axillary nodes normal  Neurologic:   CNII-XII intact, normal strength, sensation and reflexes    throughout    LABORATORY DATA:  Results for orders placed or performed in visit on 05/17/14 (from the past 48 hour(s))  Homocysteine     Status: Abnormal (Preliminary result)   Collection Time: 05/17/14  2:53 PM  Result Value Ref Range   Homocysteine 17.2 (H) 4.0 - 15.4 umol/L  Vitamin B12     Status: None (Preliminary result)   Collection Time: 05/17/14  2:53 PM  Result Value Ref Range   Vitamin B-12 294 211 - 911 pg/mL      RADIOGRAPHY: No results found.     PATHOLOGY: None   ASSESSMENT/PLAN:  Chloe Chavez is a very pleasant 23 yo female positive for the MTHFR gene. AShe is completely asymptomatic at this time. I am unsure as to whether or not this would have anything to do with her still birth and hemorrhaging.  We have ordered homocystine, C1 esterase, Von Willebrand and vitamin B levels on her. We will wait to get the results to these tests before scheduling a follow-up.  She may need anticoagulation and extra folic acid if she decides to get pregnant again.   All questions were answered. She knows to call the clinic with any problems, questions or concerns. We can certainly see her any time she may need [place]. The patient was discussed with and also seen by Dr. Marin Olp and he is in agreement with the aforementioned.   [facility] M    ADDENDUM:  I saw and examined the patient with Camryn Quesinberry. It is hard to really know what is the real issue at this time. We know that she is positive for the MTHFR gene. I don't  think that this was while she had her stillbirth. Apparent she had a 2 vessel umbilical cord.  She does have a elevated homocysteine level. She never has had any blood clots. However, I think that it would be wise to put her on folic acid.  Given the history with her sister of having severe allergies and angioedema. I did check Ms. Belenda Cruise for hereditary angioedema. Her  C1 esterase level was normal.  We did check a von Willebrand panel on her. She had low normal levels of by Willebrand factor. 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 I suppose this might be a factor in her having bleeding after her stillbirth. Typically,  if she had a von Willebrand's disease, the levels should be normal with pregnancy. I probably will have this repeated.  It's hard to know whether or not she would be a candidate for prophylactic Lovenox with pregnancy. I don't see any obvious downside with low-dose Lovenox with pregnancy.  Again, this is a very "gray area".  We will get her back in once we have all the labs back. We will put her on folic acid.  We spent about an hour with her and her husband. We answered all their questions.  I will have to speak with her gynecologist to see if there is anything else we can do for her.  Pete E.

## 2014-05-19 ENCOUNTER — Telehealth: Payer: Self-pay | Admitting: Hematology & Oncology

## 2014-05-19 NOTE — Telephone Encounter (Signed)
Pt wants to get lab at work. Per MD that's fine. I transferred call to Jackelyn Poling pt had questions about lab processing.

## 2014-05-21 LAB — VON WILLEBRAND PANEL
Coagulation Factor VIII: 53 % — ABNORMAL LOW (ref 73–140)
RISTOCETIN CO-FACTOR, PLASMA: 55 % (ref 42–200)
Von Willebrand Antigen, Plasma: 63 % (ref 50–217)

## 2014-05-21 LAB — HOMOCYSTEINE: Homocysteine: 17.2 umol/L — ABNORMAL HIGH (ref 4.0–15.4)

## 2014-05-21 LAB — VITAMIN B12: Vitamin B-12: 294 pg/mL (ref 211–911)

## 2014-05-21 LAB — C1 ESTERASE INHIBITOR: C1 ESTERASE INH: 23 mg/dL (ref 21–39)

## 2014-05-23 ENCOUNTER — Other Ambulatory Visit: Payer: Self-pay | Admitting: Family

## 2014-05-23 DIAGNOSIS — G629 Polyneuropathy, unspecified: Secondary | ICD-10-CM

## 2014-05-23 MED ORDER — FOLIC ACID 1 MG PO TABS: 1.0000 mg | ORAL_TABLET | Freq: Every day | ORAL | Status: AC

## 2014-05-24 ENCOUNTER — Other Ambulatory Visit (HOSPITAL_BASED_OUTPATIENT_CLINIC_OR_DEPARTMENT_OTHER): Payer: BC Managed Care – PPO | Admitting: Lab

## 2014-05-24 ENCOUNTER — Encounter: Payer: Self-pay | Admitting: Hematology & Oncology

## 2014-05-24 DIAGNOSIS — G629 Polyneuropathy, unspecified: Secondary | ICD-10-CM

## 2014-05-24 LAB — VITAMIN B1: VITAMIN B1 (THIAMINE): 19 nmol/L (ref 8–30)

## 2014-05-28 LAB — VON WILLEBRAND PANEL
Coagulation Factor VIII: 79 % (ref 73–140)
Ristocetin Co-factor, Plasma: 66 % (ref 42–200)
Von Willebrand Antigen, Plasma: 77 % (ref 50–217)

## 2014-05-28 LAB — VITAMIN B6: VITAMIN B6: 11.2 ng/mL (ref 2.1–21.7)

## 2014-05-30 ENCOUNTER — Telehealth: Payer: Self-pay | Admitting: *Deleted

## 2014-05-30 NOTE — Telephone Encounter (Addendum)
Spoke with patient. She understood message. Copies of labs will be sent to her home via mail per her request.  ----- Message from Volanda Napoleon, MD sent at 05/29/2014  2:25 PM EST ----- Please call and let her know that the vitamins B-6 and von Willebrand panels were all normal. I don't find any obvious bleeding issues. She does need to be on folic acid because of the elevated homocystine level. If she does get pregnant, she needs to be on blood thinner from my point of view. She can just let us know if she does get pregnant and that we can see her about anticoagulation. Thanks. Laurey Arrow

## 2016-07-14 NOTE — L&D Delivery Note (Signed)
Delivery Note Pt received an epidural and then before was fully comfortable felt the urge to push.  She pushed for about 5 minutes and at 11:54 PM a healthy female was delivered via NSVD (Presentation: OA  ).  APGAR: 8, 9; weight pending .   Placenta status: delivered spontaneously .  Cord:  with the following complications: Nuchal x 1 reduced.  Anesthesia: epidural placed but not fully functioning                     1%lidocaine local block for repair  Episiotomy: none  Lacerations: small second degree   Suture Repair: 3.0 vicryl rapide Est. Blood Loss (mL):  252mL  Mom to postpartum.  Baby to Couplet care / Skin to Skin.  Logan Bores 06/09/2017, 12:27 AM

## 2017-06-08 ENCOUNTER — Encounter (HOSPITAL_COMMUNITY): Payer: Self-pay | Admitting: Anesthesiology

## 2017-06-08 ENCOUNTER — Inpatient Hospital Stay (HOSPITAL_COMMUNITY): Payer: 59 | Admitting: Anesthesiology

## 2017-06-08 ENCOUNTER — Encounter (HOSPITAL_COMMUNITY): Payer: Self-pay | Admitting: *Deleted

## 2017-06-08 ENCOUNTER — Inpatient Hospital Stay (HOSPITAL_COMMUNITY)
Admission: AD | Admit: 2017-06-08 | Discharge: 2017-06-10 | DRG: 807 | Disposition: A | Discharge status: Home / Self Care | Payer: 59 | Source: Ambulatory Visit | Attending: Obstetrics and Gynecology | Admitting: Obstetrics and Gynecology

## 2017-06-08 ENCOUNTER — Other Ambulatory Visit: Payer: Self-pay

## 2017-06-08 DIAGNOSIS — Z3483 Encounter for supervision of other normal pregnancy, third trimester: Secondary | ICD-10-CM | POA: Diagnosis present

## 2017-06-08 DIAGNOSIS — Z3A39 39 weeks gestation of pregnancy: Secondary | ICD-10-CM | POA: Diagnosis not present

## 2017-06-08 DIAGNOSIS — Z880 Allergy status to penicillin: Secondary | ICD-10-CM

## 2017-06-08 HISTORY — DX: Endocrine, nutritional and metabolic diseases complicating pregnancy, unspecified trimester: O99.280

## 2017-06-08 HISTORY — DX: Benign neoplasm of connective and other soft tissue, unspecified: D21.9

## 2017-06-08 HISTORY — DX: Methylenetetrahydrofolate reductase deficiency: E72.12

## 2017-06-08 LAB — CBC
HCT: 32.2 % — ABNORMAL LOW (ref 36.0–46.0)
HEMOGLOBIN: 10.6 g/dL — AB (ref 12.0–15.0)
MCH: 27.7 pg (ref 26.0–34.0)
MCHC: 32.9 g/dL (ref 30.0–36.0)
MCV: 84.1 fL (ref 78.0–100.0)
Platelets: 220 10*3/uL (ref 150–400)
RBC: 3.83 MIL/uL — ABNORMAL LOW (ref 3.87–5.11)
RDW: 13.5 % (ref 11.5–15.5)
WBC: 10.9 10*3/uL — ABNORMAL HIGH (ref 4.0–10.5)

## 2017-06-08 LAB — TYPE AND SCREEN
ABO/RH(D): O POS
ANTIBODY SCREEN: NEGATIVE

## 2017-06-08 LAB — ABO/RH: ABO/RH(D): O POS

## 2017-06-08 LAB — OB RESULTS CONSOLE RPR: RPR: NONREACTIVE

## 2017-06-08 LAB — OB RESULTS CONSOLE GC/CHLAMYDIA
CHLAMYDIA, DNA PROBE: NEGATIVE
GC PROBE AMP, GENITAL: NEGATIVE

## 2017-06-08 LAB — OB RESULTS CONSOLE HEPATITIS B SURFACE ANTIGEN: HEP B S AG: NEGATIVE

## 2017-06-08 LAB — OB RESULTS CONSOLE HIV ANTIBODY (ROUTINE TESTING): HIV: NONREACTIVE

## 2017-06-08 LAB — OB RESULTS CONSOLE GBS: GBS: NEGATIVE

## 2017-06-08 LAB — OB RESULTS CONSOLE RUBELLA ANTIBODY, IGM: Rubella: IMMUNE

## 2017-06-08 MED ORDER — BUTORPHANOL TARTRATE 1 MG/ML IJ SOLN: 1.0000 mg | INTRAMUSCULAR | Status: DC | PRN

## 2017-06-08 MED ORDER — PHENYLEPHRINE 40 MCG/ML (10ML) SYRINGE FOR IV PUSH (FOR BLOOD PRESSURE SUPPORT)
80.0000 ug | PREFILLED_SYRINGE | INTRAVENOUS | Status: DC | PRN
  Filled 2017-06-08: qty 5
  Filled 2017-06-08: qty 10

## 2017-06-08 MED ORDER — LACTATED RINGERS IV SOLN
INTRAVENOUS | Status: DC
  Administered 2017-06-08: 19:00:00 via INTRAVENOUS

## 2017-06-08 MED ORDER — OXYCODONE-ACETAMINOPHEN 5-325 MG PO TABS: 1.0000 | ORAL_TABLET | ORAL | Status: DC | PRN

## 2017-06-08 MED ORDER — OXYCODONE-ACETAMINOPHEN 5-325 MG PO TABS: 2.0000 | ORAL_TABLET | ORAL | Status: DC | PRN

## 2017-06-08 MED ORDER — EPHEDRINE 5 MG/ML INJ
10.0000 mg | INTRAVENOUS | Status: DC | PRN
  Filled 2017-06-08: qty 2

## 2017-06-08 MED ORDER — SOD CITRATE-CITRIC ACID 500-334 MG/5ML PO SOLN: 30.0000 mL | ORAL | Status: DC | PRN

## 2017-06-08 MED ORDER — LIDOCAINE HCL (PF) 1 % IJ SOLN
INTRAMUSCULAR | Status: DC | PRN
  Administered 2017-06-08 (×2): 5 mL via EPIDURAL

## 2017-06-08 MED ORDER — OXYTOCIN 40 UNITS IN LACTATED RINGERS INFUSION - SIMPLE MED: 1.0000 m[IU]/min | INTRAVENOUS | Status: DC

## 2017-06-08 MED ORDER — ONDANSETRON HCL 4 MG/2ML IJ SOLN: 4.0000 mg | Freq: Four times a day (QID) | INTRAMUSCULAR | Status: DC | PRN

## 2017-06-08 MED ORDER — OXYTOCIN 40 UNITS IN LACTATED RINGERS INFUSION - SIMPLE MED
2.5000 [IU]/h | INTRAVENOUS | Status: DC
  Filled 2017-06-08: qty 1000

## 2017-06-08 MED ORDER — ACETAMINOPHEN 325 MG PO TABS: 650.0000 mg | ORAL_TABLET | ORAL | Status: DC | PRN

## 2017-06-08 MED ORDER — LACTATED RINGERS IV SOLN: 500.0000 mL | INTRAVENOUS | Status: DC | PRN

## 2017-06-08 MED ORDER — TERBUTALINE SULFATE 1 MG/ML IJ SOLN
0.2500 mg | Freq: Once | INTRAMUSCULAR | Status: DC | PRN
  Filled 2017-06-08: qty 1

## 2017-06-08 MED ORDER — FENTANYL 2.5 MCG/ML BUPIVACAINE 1/10 % EPIDURAL INFUSION (WH - ANES)
14.0000 mL/h | INTRAMUSCULAR | Status: DC | PRN
  Administered 2017-06-08: 14 mL/h via EPIDURAL
  Filled 2017-06-08: qty 100

## 2017-06-08 MED ORDER — LACTATED RINGERS IV SOLN: 500.0000 mL | Freq: Once | INTRAVENOUS | Status: DC

## 2017-06-08 MED ORDER — DIPHENHYDRAMINE HCL 50 MG/ML IJ SOLN: 12.5000 mg | INTRAMUSCULAR | Status: DC | PRN

## 2017-06-08 MED ORDER — LIDOCAINE HCL (PF) 1 % IJ SOLN
30.0000 mL | INTRAMUSCULAR | Status: DC | PRN
  Administered 2017-06-09: 30 mL via SUBCUTANEOUS
  Filled 2017-06-08: qty 30

## 2017-06-08 MED ORDER — PHENYLEPHRINE 40 MCG/ML (10ML) SYRINGE FOR IV PUSH (FOR BLOOD PRESSURE SUPPORT)
80.0000 ug | PREFILLED_SYRINGE | INTRAVENOUS | Status: DC | PRN
  Filled 2017-06-08: qty 5

## 2017-06-08 MED ORDER — OXYTOCIN BOLUS FROM INFUSION
500.0000 mL | Freq: Once | INTRAVENOUS | Status: AC
  Administered 2017-06-09: 500 mL via INTRAVENOUS

## 2017-06-08 NOTE — Anesthesia Preprocedure Evaluation (Signed)

## 2017-06-08 NOTE — H&P (Signed)
Chloe Chavez is a 26 y.o. female G3P1101 at 43 1/7 weeks (EDD 06/14/17 by LMP c/w 8 week [place])  presenting for leakage of fluid with nitrazine + pool noted in office this PM.  She has had some mild contractions off and on for .   several days. 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  She then had a delayed pp hemorrhage one week later requiring a D&C.    OB History    Gravida Para Term Preterm AB Living   3 2 1 1  0 1   SAB TAB Ectopic Multiple Live Births   0 0 0 0 1    2010 NSVD 8#1oz 2013 NSVD 4#1oz 31 week IUFD delayed pp hemorrhage  Past Medical History:  Diagnosis Date  . Fibroid   . MTHFR deficiency complicating pregnancy ([place])   . No pertinent past medical history    Past Surgical History:  Procedure Laterality Date  . BREAST SURGERY  2006   Cyst removal right breat  . DILATION AND CURETTAGE OF UTERUS  2013   1 week after delivery in [place], [place]   Family History: family history is not on file. Social History:  reports that  has never smoked. she has never used smokeless tobacco. She reports that she does not drink alcohol or use drugs.     Maternal Diabetes: No Genetic Screening: Declined Maternal Ultrasounds/Referrals: Normal Fetal Ultrasounds or other Referrals:  None Maternal Substance Abuse:  No Significant Maternal Medications:  None Significant Maternal Lab Results:  None Other Comments:  None  Review of Systems  Gastrointestinal: Negative for abdominal pain.  Neurological: Negative for headaches.   Maternal Medical History:  Reason for admission: Rupture of membranes.   Contractions: Onset was 3-5 hours ago.   Frequency: irregular.   Perceived severity is mild.    Fetal activity: Perceived fetal activity is normal.    Prenatal complications: Bleeding.   Prenatal Complications - Diabetes: none.      Blood pressure 119/68, pulse 89, temperature 97.9 F (36.6 C), temperature  source Oral, resp. rate 18, height 5\' 4"  (1.626 m), weight 77.1 kg (170 lb), unknown if currently breastfeeding.   FHR category 1  Maternal Exam:  Uterine Assessment: Contraction strength is mild.  Contraction frequency is irregular.   Abdomen: Patient reports no abdominal tenderness. Fetal presentation: vertex  Introitus: Normal vulva. Normal vagina.    Physical Exam  Constitutional: She appears well-developed.  Cardiovascular: Normal rate and regular rhythm.  Respiratory: Effort normal and breath sounds normal.  GI: Soft.  Genitourinary: Vagina normal.  Neurological: She is alert.  Psychiatric: She has a normal mood and affect. Her behavior is normal.    Prenatal labs: ABO, Rh:  O positive Antibody:  Negative Rubella: Immune (11/26 1739) RPR: Nonreactive (11/26 1739)  HBsAg: Negative (11/26 1739)  HIV: Non-reactive (11/26 1739)  GBS: Negative (11/26 1739)  Declined genetic screens One hour GCT 63  Assessment/Plan: Pt admitted for SROM with palpable forebag.  Pt wanted to walk for an hour then will rupture forebag and follow progress.  Mild contractions, but intensifying and hopes to deliver with no pain meds.     Logan Bores 06/08/2017, 7:33 PM

## 2017-06-08 NOTE — Anesthesia Procedure Notes (Signed)
Epidural Patient location during procedure: OB Start time: 06/08/2017 11:40 PM End time: 06/08/2017 11:45 PM  Staffing Anesthesiologist: Janeece Riggers, MD  Preanesthetic Checklist Completed: patient identified, site marked, surgical consent, pre-op evaluation, timeout performed, IV checked, risks and benefits discussed and monitors and equipment checked  Epidural Patient position: sitting Prep: site prepped and draped and DuraPrep Patient monitoring: continuous pulse ox and blood pressure Approach: midline Location: L4-L5 Injection technique: LOR air  Needle:  Needle type: Tuohy  Needle gauge: 17 G Needle length: 9 cm and 9 Needle insertion depth: 6 cm Catheter type: closed end flexible Catheter size: 19 Gauge Catheter at skin depth: 11 cm Test dose: negative  Assessment Events: blood not aspirated, injection not painful, no injection resistance, negative IV test and no paresthesia

## 2017-06-08 NOTE — Anesthesia Pain Management Evaluation Note (Signed)
  CRNA Pain Management Visit Note  Patient: Chloe Chavez, 26 y.o., female  "Hello I am a member of the anesthesia team at [facility]. We have an anesthesia team available at all times to provide care throughout the hospital, including epidural management and anesthesia for C-section. I don't know your plan for the delivery whether it a natural birth, water birth, IV sedation, nitrous supplementation, doula or epidural, but we want to meet your pain goals."   1.Was your pain managed to your expectations on prior hospitalizations?   Yes   2.What is your expectation for pain management during this hospitalization?     Labor support without medications  3.How can we help you reach that goal? unsure  Record the patient's initial score and the patient's pain goal.   Pain: 6  Pain Goal: 10 [facility] wants you to be able to say your pain was always managed very well.  Casimer Lanius 06/08/2017

## 2017-06-08 NOTE — Anesthesia Postprocedure Evaluation (Signed)
Anesthesia Post Note  Patient: Chloe Chavez  Procedure(s) Performed: AN AD [place]     Patient location during evaluation: Mother Baby Anesthesia Type: Epidural Level of consciousness: awake and alert Pain management: pain level controlled Vital Signs Assessment: post-procedure vital signs reviewed and stable Respiratory status: spontaneous breathing, nonlabored ventilation and respiratory function stable Cardiovascular status: stable Postop Assessment: no headache, no backache and epidural receding Anesthetic complications: no    Last Vitals:  Vitals:   06/08/17 2205 06/08/17 2344  BP:  101/69  Pulse:  93  Resp: 19   Temp: 36.4 C     Last Pain:  Vitals:   06/08/17 2205  TempSrc: Oral  PainSc:    Pain Goal:                 Florentina Marquart

## 2017-06-08 NOTE — Progress Notes (Signed)
Patient ID: Chloe Chavez, female   DOB: 29-Oct-1990, 26 y.o.   MRN: [id number] Pt back from walking and contractions slightly more intense, but not obviously active labor  FHR Category 1 Cervix 80/4-5/-1 AROM forebag  Will follow progress, augment with low dose pitocin if needed Aware can have epidural if needed, wants to try without

## 2017-06-09 LAB — CBC
HEMATOCRIT: 29.5 % — AB (ref 36.0–46.0)
Hemoglobin: 10.1 g/dL — ABNORMAL LOW (ref 12.0–15.0)
MCH: 28.5 pg (ref 26.0–34.0)
MCHC: 34.2 g/dL (ref 30.0–36.0)
MCV: 83.3 fL (ref 78.0–100.0)
Platelets: 178 10*3/uL (ref 150–400)
RBC: 3.54 MIL/uL — ABNORMAL LOW (ref 3.87–5.11)
RDW: 13.3 % (ref 11.5–15.5)
WBC: 17.6 10*3/uL — ABNORMAL HIGH (ref 4.0–10.5)

## 2017-06-09 LAB — RPR: RPR: NONREACTIVE

## 2017-06-09 MED ORDER — ACETAMINOPHEN 325 MG PO TABS: 650.0000 mg | ORAL_TABLET | ORAL | Status: DC | PRN

## 2017-06-09 MED ORDER — COCONUT OIL OIL
1.0000 | TOPICAL_OIL | Status: DC | PRN
Start: 2017-06-09 — End: 2017-06-10

## 2017-06-09 MED ORDER — DIBUCAINE 1 % RE OINT: 1.0000 "application " | TOPICAL_OINTMENT | RECTAL | Status: DC | PRN

## 2017-06-09 MED ORDER — IBUPROFEN 600 MG PO TABS
600.0000 mg | ORAL_TABLET | Freq: Four times a day (QID) | ORAL | Status: DC
  Administered 2017-06-09 – 2017-06-10 (×4): 600 mg via ORAL
  Filled 2017-06-09 (×5): qty 1

## 2017-06-09 MED ORDER — TETANUS-DIPHTH-ACELL PERTUSSIS 5-2.5-18.5 LF-MCG/0.5 IM SUSP: 0.5000 mL | Freq: Once | INTRAMUSCULAR | Status: DC

## 2017-06-09 MED ORDER — ZOLPIDEM TARTRATE 5 MG PO TABS: 5.0000 mg | ORAL_TABLET | Freq: Every evening | ORAL | Status: DC | PRN

## 2017-06-09 MED ORDER — WITCH HAZEL-GLYCERIN EX PADS
1.0000 "application " | MEDICATED_PAD | CUTANEOUS | Status: DC | PRN
  Administered 2017-06-09: 1 via TOPICAL

## 2017-06-09 MED ORDER — SENNOSIDES-DOCUSATE SODIUM 8.6-50 MG PO TABS: 2.0000 | ORAL_TABLET | ORAL | Status: DC

## 2017-06-09 MED ORDER — SIMETHICONE 80 MG PO CHEW: 80.0000 mg | CHEWABLE_TABLET | ORAL | Status: DC | PRN

## 2017-06-09 MED ORDER — ONDANSETRON HCL 4 MG PO TABS: 4.0000 mg | ORAL_TABLET | ORAL | Status: DC | PRN

## 2017-06-09 MED ORDER — PRENATAL MULTIVITAMIN CH
1.0000 | ORAL_TABLET | Freq: Every day | ORAL | Status: DC
  Administered 2017-06-09: 1 via ORAL
  Filled 2017-06-09: qty 1

## 2017-06-09 MED ORDER — ONDANSETRON HCL 4 MG/2ML IJ SOLN: 4.0000 mg | INTRAMUSCULAR | Status: DC | PRN

## 2017-06-09 MED ORDER — BENZOCAINE-MENTHOL 20-0.5 % EX AERO
1.0000 "application " | INHALATION_SPRAY | CUTANEOUS | Status: DC | PRN
  Administered 2017-06-09: 1 via TOPICAL

## 2017-06-09 MED ORDER — DIPHENHYDRAMINE HCL 25 MG PO CAPS: 25.0000 mg | ORAL_CAPSULE | Freq: Four times a day (QID) | ORAL | Status: DC | PRN

## 2017-06-09 NOTE — Progress Notes (Signed)
Post Partum Day 1  Subjective: no complaints, up ad lib and tolerating PO  Objective: Blood pressure 109/68, pulse 61, temperature (!) 97.5 F (36.4 C), temperature source Oral, resp. rate 16, height 5\' 4"  (1.626 m), weight 77.1 kg (170 lb), SpO2 99 %, unknown if currently breastfeeding.  Physical Exam:  General: alert and cooperative Lochia: appropriate Uterine Fundus: firm   Recent Labs    06/08/17 1831 06/09/17 0546  HGB 10.6* 10.1*  HCT 32.2* 29.5*    Assessment/Plan: Plan for discharge tomorrow   LOS: 1 day   Logan Bores 06/09/2017, 8:45 AM

## 2017-06-09 NOTE — Lactation Note (Signed)
This note was copied from a baby's chart. Lactation Consultation Note  Patient Name: Chloe Chavez [id number] Date: 06/09/2017 Reason for consult: Initial assessment   P3, 2nd child stillborn.  Mother breastfed first child for 4 months when she was in school. Discussed pumping and going back to work Baby was latched upon entering.  Lips flanged, sucks and swallows observed. Reviewed basics. Mom encouraged to feed baby 8-12 times/24 hours and with feeding cues.  Mom made aware of O/P services, breastfeeding support groups, community resources, and our phone # for post-discharge questions.     Maternal Data Has patient been taught Hand Expression?: Yes Does the patient have breastfeeding experience prior to this delivery?: Yes  Feeding Feeding Type: Breast Fed  LATCH Score Latch: Grasps breast easily, tongue down, lips flanged, rhythmical sucking.(baby latched upon entering)  Audible Swallowing: A few with stimulation  Type of Nipple: Everted at rest and after stimulation  Comfort (Breast/Nipple): Soft / non-tender  Hold (Positioning): Assistance needed to correctly position infant at breast and maintain latch.  LATCH Score: 8  Interventions    Lactation Tools Discussed/Used     Consult Status Consult Status: Follow-up Date: 06/10/17 Follow-up type: In-patient    Vivianne Master [facility] 06/09/2017, 4:22 PM

## 2017-06-09 NOTE — Plan of Care (Signed)
  Coping: Ability to identify and utilize available resources and services will improve 06/09/2017 1021 by Tollie Eth, RN Note Discussed and provided patient with [place] Postpartum Depression scoring tool and requested that she fill it out as soon as possible. Patient verbalized understanding.

## 2017-06-09 NOTE — Addendum Note (Signed)
Addendum  created 06/09/17 0820 by Jonna Munro, CRNA   Charge Capture section accepted, Sign clinical note

## 2017-06-09 NOTE — Anesthesia Postprocedure Evaluation (Signed)
Anesthesia Post Note  Patient: Chloe Chavez  Procedure(s) Performed: AN AD [place]     Patient location during evaluation: Mother Baby Anesthesia Type: Epidural Level of consciousness: awake and alert and oriented Pain management: satisfactory to patient Vital Signs Assessment: post-procedure vital signs reviewed and stable Respiratory status: spontaneous breathing and nonlabored ventilation Cardiovascular status: stable Postop Assessment: no headache, no backache, no signs of nausea or vomiting, adequate PO intake and patient able to bend at knees (patient up walking) Anesthetic complications: no    Last Vitals:  Vitals:   06/09/17 0327 06/09/17 0725  BP:  109/68  Pulse:  61  Resp:  16  Temp: 37.2 C (!) 36.4 C  SpO2:      Last Pain:  Vitals:   06/09/17 0725  TempSrc: Oral  PainSc: 0-No pain   Pain Goal:                 Bloor,Fermina Mishkin

## 2017-06-09 NOTE — Plan of Care (Signed)
  Education: Knowledge of condition will improve 06/09/2017 1026 by Tollie Eth, RN Note Patient declines all immunizations. Patient has also declined scheduled ibuprofen for now. Discussed schedule of ibuprofen with patient and encouraged her to call if she decided she needed that or tylenol. Patient states she is comfortable for now.

## 2017-06-10 NOTE — Discharge Summary (Signed)
OB Discharge Summary     Patient Name: Chloe Chavez DOB: 01-17-1991 MRN: [id number]  Date of admission: 06/08/2017 Delivering MD: Paula Compton   Date of discharge: 06/10/2017  Admitting diagnosis: LABOR Intrauterine pregnancy: [redacted]w[redacted]d     Secondary diagnosis:  Active Problems:   Indication for care in labor or delivery   NSVD (normal spontaneous vaginal delivery)  Additional problems: none     Discharge diagnosis: Term Pregnancy Delivered                                                                                                Post partum procedures:none  Augmentation: AROM  Complications: None  Hospital course:  Onset of Labor With Vaginal Delivery     26 y.o. yo G3P1101 at [redacted]w[redacted]d was admitted in Active Labor on 06/08/2017. Patient had an uncomplicated labor course as follows:  Membrane Rupture Time/Date: 4:00 PM ,06/08/2017   Intrapartum Procedures: Episiotomy: None [1]                                         Lacerations:  2nd degree [3]  Patient had a delivery of a Viable infant. 06/08/2017  Information for the patient's newborn:  Wilena, Tyndall [[id number]]  Delivery Method: Vaginal, Spontaneous(Filed from Delivery Summary)    Pateint had an uncomplicated postpartum course.  She is ambulating, tolerating a regular diet, passing flatus, and urinating well. Patient is discharged home in stable condition on 06/10/17.   Physical exam  Vitals:   06/09/17 0725 06/09/17 1423 06/09/17 1734 06/10/17 0542  BP: 109/68 (!) 108/57 (!) 103/59 105/72  Pulse: 61 71 73 66  Resp: 16 16 18 18   Temp: (!) 97.5 F (36.4 C) 97.7 F (36.5 C) 97.8 F (36.6 C) (!) 97.3 F (36.3 C)  TempSrc: Oral Oral Oral Oral  SpO2:  100%    Weight:      Height:       General: alert, cooperative and no distress Lochia: appropriate Uterine Fundus: firm Incision: N/A DVT Evaluation: No evidence of DVT seen on physical exam. Labs: Lab Results  Component Value Date   WBC  17.6 (H) 06/09/2017   HGB 10.1 (L) 06/09/2017   HCT 29.5 (L) 06/09/2017   MCV 83.3 06/09/2017   PLT 178 06/09/2017   No flowsheet data found.  Discharge instruction: per After Visit Summary and "Baby and Me Booklet".  After visit meds:  Allergies as of 06/10/2017      Reactions   Augmentin [amoxicillin-pot Clavulanate] Hives, Swelling   Ceclor [cefaclor] Hives, Swelling   Penicillins Hives, Swelling   Has patient had a PCN reaction causing immediate rash, facial/tongue/throat swelling, SOB or lightheadedness with hypotension: No Has patient had a PCN reaction causing severe rash involving mucus membranes or skin necrosis: No Has patient had a PCN reaction that required hospitalization: No Has patient had a PCN reaction occurring within the last 10 years: No If all of the above answers are "NO", then may proceed with Cephalosporin use.  Latex Rash      Medication List    TAKE these medications   folic acid 1 MG tablet Commonly known as:  FOLVITE Take 1 tablet (1 mg total) by mouth daily.   prenatal multivitamin Tabs tablet Take 1 tablet by mouth daily at 12 noon.       Diet: routine diet  Activity: Advance as tolerated. Pelvic rest for 6 weeks.   Outpatient follow ZW:CHENI call in three weeks and postpartum visit in 6 weeks Follow up Appt:No future appointments. Follow up Visit:No Follow-up on file.  Postpartum contraception: Not Discussed  Newborn Data: Live born female  Birth Weight: 7 lb 13.4 oz (3555 g) APGAR: 51, 9  Newborn Delivery   Birth date/time:  06/08/2017 23:54:00 Delivery type:  Vaginal, Spontaneous     Baby Feeding: Breast Disposition:home with mother   06/10/2017 Isaiah Serge, DO

## 2017-06-10 NOTE — Progress Notes (Signed)
Patient ID: Chloe Chavez, female   DOB: [birth year], 26 y.o.   MRN: [id number] Pt doing well. Pain well controlled, lochia mild, no fever, chills, SOB or CP. Ambulating and tolerating diet well. Fatigued from cluster feeding overnight. Bonding well with baby. VSS ABD - FF and 3cm below umbilicus EXT - no homans  A/P: PPD#2 s/p svd - stable         Discharge instructions reviewed

## 2017-06-10 NOTE — Lactation Note (Signed)
This note was copied from a baby's chart. Lactation Consultation Note  Patient Name: Girl Jahmya Onofrio TZGYF'V Date: 06/10/2017 Reason for consult: Follow-up assessment;Other (Comment)(Cystectomy.)  Baby 63 hours old. Mom reports that she had benign cystectomy prior to nursing older child--and had good breast milk supply. Mom reports that this baby is nursing well, and she cluster-fed through the night. Mom denies any desire for [facility] assistance. Mom aware of OP/BFSG and [employer] phone line assistance after D/C.  Maternal Data    Feeding Feeding Type: Breast Fed Length of feed: 30 min  LATCH Score Latch: Grasps breast easily, tongue down, lips flanged, rhythmical sucking.(per mother; observed end of feeding)  Audible Swallowing: A few with stimulation(mother states more frequent at beginning of feeding)  Type of Nipple: Everted at rest and after stimulation  Comfort (Breast/Nipple): Soft / non-tender  Hold (Positioning): No assistance needed to correctly position infant at breast.  LATCH Score: 9  Interventions    Lactation Tools Discussed/Used     Consult Status Consult Status: PRN    Andres Labrum 06/10/2017, 9:28 AM

## 2017-06-10 NOTE — Discharge Instructions (Signed)
Nothing in vagina for 6 weeks.  No sex, tampons, and douching.  Other instructions as in [facility] Discharge Booklet. °

## 2017-06-14 ENCOUNTER — Encounter (HOSPITAL_COMMUNITY): Payer: Self-pay | Admitting: *Deleted

## 2017-06-14 NOTE — Progress Notes (Signed)
Pt sitting for epidural & started to scream as epidural complete.  Dr. Marvel Plan at bedside with pt.  Pt repositioned in bed & pushed for delivery of baby.

## 2020-01-17 ENCOUNTER — Ambulatory Visit (INDEPENDENT_AMBULATORY_CARE_PROVIDER_SITE_OTHER): Payer: 59 | Admitting: Orthopaedic Surgery

## 2020-01-17 DIAGNOSIS — S83004A Unspecified dislocation of right patella, initial encounter: Secondary | ICD-10-CM | POA: Diagnosis not present

## 2020-01-17 MED ORDER — IBUPROFEN 800 MG PO TABS: 800.0000 mg | ORAL_TABLET | Freq: Three times a day (TID) | ORAL | 2 refills | Status: AC | PRN

## 2020-01-17 NOTE — Progress Notes (Signed)
Office Visit Note   Patient: Chloe Chavez           Date of Birth: 02-20-1991           MRN: [id number] Visit Date: 01/17/2020              Requested by: No referring provider defined for this encounter. PCP: Patient, No Pcp Per   Assessment & Plan: Visit Diagnoses:  1. Closed dislocation of right patella, initial encounter     Plan: Impression is acute lateral patellar dislocation.  We will immobilized in a knee immobilizer for couple weeks.  Prescription for Advil.  Weight-bear as tolerated in the knee immobilizer.  Crutches as needed.  RICE.  Recheck in 2 weeks.  Questions encouraged and answered.  Follow-Up Instructions: Return in about 2 weeks (around 01/31/2020).   Orders:  No orders of the defined types were placed in this encounter.  Meds ordered this encounter  Medications   ibuprofen (ADVIL) 800 MG tablet    Sig: Take 1 tablet (800 mg total) by mouth every 8 (eight) hours as needed.    Dispense:  30 tablet    Refill:  2      Procedures: No procedures performed   Clinical Data: No additional findings.   Subjective: Chief Complaint  Patient presents with   Right Knee - Pain    Chloe Chavez is a 29 year old female who used to work as a Marine scientist at [employer] who comes in for evaluation of recent right knee injury.  She was horsing around with her father and sustained an acute right patellar dislocation which self reduced 20 minutes later.  She was initially evaluated in the ER locally and the x-rays were normal and they were told to obtain MRI.  She was placed in a knee immobilizer.  She is concerned that she continues to have swelling and discomfort.  No numbness or tingling.   Review of Systems  Constitutional: Negative.   HENT: Negative.   Eyes: Negative.   Respiratory: Negative.   Cardiovascular: Negative.   Endocrine: Negative.   Musculoskeletal: Negative.   Neurological: Negative.   Hematological: Negative.   Psychiatric/Behavioral:  Negative.   All other systems reviewed and are negative.    Objective: Vital Signs: There were no vitals taken for this visit.  Physical Exam Vitals and nursing note reviewed.  Constitutional:      Appearance: She is well-developed.  HENT:     Head: Normocephalic and atraumatic.  Pulmonary:     Effort: Pulmonary effort is normal.  Abdominal:     Palpations: Abdomen is soft.  Musculoskeletal:     Cervical back: Neck supple.  Skin:    General: Skin is warm.     Capillary Refill: Capillary refill takes less than 2 seconds.  Neurological:     Mental Status: She is alert and oriented to person, place, and time.  Psychiatric:        Behavior: Behavior normal.        Thought Content: Thought content normal.        Judgment: Judgment normal.     Ortho Exam Right knee shows moderate swelling.  Tenderness along the medial patellar retinaculum.  Collaterals and cruciates are stable.  No joint line tenderness.  Positive patellar apprehension. Specialty Comments:  No specialty comments available.  Imaging: No results found.   PMFS History: Patient Active Problem List   Diagnosis Date Noted   Closed dislocation of right patella 01/17/2020   NSVD (normal  spontaneous vaginal delivery) 06/09/2017   Indication for care in labor or delivery 06/08/2017   Neuropathy 05/23/2014   Past Medical History:  Diagnosis Date   Fibroid    MTHFR deficiency complicating pregnancy ([place])    No pertinent past medical history     No family history on file.  Past Surgical History:  Procedure Laterality Date   BREAST SURGERY  2006   Cyst removal right breat   DILATION AND CURETTAGE OF UTERUS  2013   1 week after delivery in [place], [place]   Social History   Occupational History   Not on file  Tobacco Use   Smoking status: Never Smoker   Smokeless tobacco: Never Used  Substance and Sexual Activity   Alcohol use: No   Drug use: No   Sexual activity: Yes

## 2020-02-01 ENCOUNTER — Encounter: Payer: Self-pay | Admitting: Orthopaedic Surgery

## 2020-02-01 ENCOUNTER — Ambulatory Visit (INDEPENDENT_AMBULATORY_CARE_PROVIDER_SITE_OTHER): Payer: 59 | Admitting: Orthopaedic Surgery

## 2020-02-01 DIAGNOSIS — S83004A Unspecified dislocation of right patella, initial encounter: Secondary | ICD-10-CM

## 2020-02-01 NOTE — Progress Notes (Signed)
° °  Office Visit Note   Patient: Chloe Chavez           Date of Birth: 09/22/1990           MRN: [id number] Visit Date: 02/01/2020              Requested by: No referring provider defined for this encounter. PCP: Patient, No Pcp Per   Assessment & Plan: Visit Diagnoses:  1. Closed dislocation of right patella, initial encounter     Plan: At this point we will switch her over to a PSO brace.  Outpatient PT referral made.  Recheck in 4 weeks.  Follow-Up Instructions: Return in about 4 weeks (around 02/29/2020).   Orders:  No orders of the defined types were placed in this encounter.  No orders of the defined types were placed in this encounter.     Procedures: No procedures performed   Clinical Data: No additional findings.   Subjective: Chief Complaint  Patient presents with   Right Knee - Follow-up    DOI 01/13/2020    Merrillyn returns today for her right patella dislocation.  Overall she has been doing well.   Review of Systems   Objective: Vital Signs: There were no vitals taken for this visit.  Physical Exam  Ortho Exam Right knee shows a trace joint effusion.  Positive patellar apprehension. Specialty Comments:  No specialty comments available.  Imaging: No results found.   PMFS History: Patient Active Problem List   Diagnosis Date Noted   Closed dislocation of right patella 01/17/2020   NSVD (normal spontaneous vaginal delivery) 06/09/2017   Indication for care in labor or delivery 06/08/2017   Neuropathy 05/23/2014   Past Medical History:  Diagnosis Date   Fibroid    MTHFR deficiency complicating pregnancy ([place])    No pertinent past medical history     No family history on file.  Past Surgical History:  Procedure Laterality Date   BREAST SURGERY  2006   Cyst removal right breat   DILATION AND CURETTAGE OF UTERUS  2013   1 week after delivery in [place], [place]   Social History   Occupational History   Not on file    Tobacco Use   Smoking status: Never Smoker   Smokeless tobacco: Never Used  Substance and Sexual Activity   Alcohol use: No   Drug use: No   Sexual activity: Yes

## 2020-03-06 ENCOUNTER — Encounter: Payer: Self-pay | Admitting: Orthopaedic Surgery

## 2020-03-06 ENCOUNTER — Ambulatory Visit (INDEPENDENT_AMBULATORY_CARE_PROVIDER_SITE_OTHER): Payer: 59 | Admitting: Orthopaedic Surgery

## 2020-03-06 DIAGNOSIS — S83004D Unspecified dislocation of right patella, subsequent encounter: Secondary | ICD-10-CM | POA: Diagnosis not present

## 2020-03-06 NOTE — Progress Notes (Signed)
° °  Office Visit Note   Patient: Chloe Chavez           Date of Birth: [birth year]           MRN: [id number] Visit Date: 03/06/2020              Requested by: No referring provider defined for this encounter. PCP: Patient, No Pcp Per   Assessment & Plan: Visit Diagnoses:  1. Closed dislocation of right patella, subsequent encounter     Plan: Impression is 7.5 weeks status post right knee patella dislocation.  Patient continues to progress.  She will start to wean out of the PSO brace as tolerated.  She will continue with physical therapy where she will start to advance without the brace.  Follow-up with [place] as needed.  Follow-Up Instructions: Return if symptoms worsen or fail to improve.   Orders:  No orders of the defined types were placed in this encounter.  No orders of the defined types were placed in this encounter.     Procedures: No procedures performed   Clinical Data: No additional findings.   Subjective: Chief Complaint  Patient presents with   Right Knee - Pain    HPI patient is a pleasant 29 year old female who presents to our clinic today approximately 7-1/2 weeks out right patella dislocation 01/14/2020.  She has been doing fairly well.  No pain.  She still has slight apprehension.  She has been wearing her PSO brace which does seem to provide some support.  She has been in physical therapy making good progress.  She is still unable to squat and feels like she has not regained full strength, however.  Review of Systems as detailed in HPI.  All others reviewed and are negative.   Objective: Vital Signs: There were no vitals taken for this visit.  Physical Exam well-developed well-nourished female no acute distress.  Alert and oriented x3.  Ortho Exam examination of her right knee reveals very slight patellar apprehension.  She is unable to fully extend to neutral.  She is able to flex to about 110 degrees.  4 out of 5 strength with resisted straight leg  raise.  She is neurovascularly intact distally.  Specialty Comments:  No specialty comments available.  Imaging: No new imaging   PMFS History: Patient Active Problem List   Diagnosis Date Noted   Closed dislocation of right patella 01/17/2020   NSVD (normal spontaneous vaginal delivery) 06/09/2017   Indication for care in labor or delivery 06/08/2017   Neuropathy 05/23/2014   Past Medical History:  Diagnosis Date   Fibroid    MTHFR deficiency complicating pregnancy ([place])    No pertinent past medical history     History reviewed. No pertinent family history.  Past Surgical History:  Procedure Laterality Date   BREAST SURGERY  2006   Cyst removal right breat   DILATION AND CURETTAGE OF UTERUS  2013   1 week after delivery in [place], [place]   Social History   Occupational History   Not on file  Tobacco Use   Smoking status: Never Smoker   Smokeless tobacco: Never Used  Substance and Sexual Activity   Alcohol use: No   Drug use: No   Sexual activity: Yes

## 2023-04-14 ENCOUNTER — Emergency Department (HOSPITAL_BASED_OUTPATIENT_CLINIC_OR_DEPARTMENT_OTHER): Payer: 59

## 2023-04-14 ENCOUNTER — Emergency Department (HOSPITAL_BASED_OUTPATIENT_CLINIC_OR_DEPARTMENT_OTHER)
Admission: EM | Admit: 2023-04-14 | Discharge: 2023-04-14 | Disposition: A | Discharge status: Home / Self Care | Payer: 59 | Attending: Emergency Medicine | Admitting: Emergency Medicine

## 2023-04-14 ENCOUNTER — Encounter (HOSPITAL_BASED_OUTPATIENT_CLINIC_OR_DEPARTMENT_OTHER): Payer: Self-pay | Admitting: Emergency Medicine

## 2023-04-14 ENCOUNTER — Emergency Department (HOSPITAL_BASED_OUTPATIENT_CLINIC_OR_DEPARTMENT_OTHER): Payer: 59 | Admitting: Radiology

## 2023-04-14 DIAGNOSIS — M79662 Pain in left lower leg: Secondary | ICD-10-CM | POA: Insufficient documentation

## 2023-04-14 DIAGNOSIS — R42 Dizziness and giddiness: Secondary | ICD-10-CM | POA: Insufficient documentation

## 2023-04-14 DIAGNOSIS — Z91040 Latex allergy status: Secondary | ICD-10-CM | POA: Insufficient documentation

## 2023-04-14 DIAGNOSIS — R079 Chest pain, unspecified: Secondary | ICD-10-CM | POA: Diagnosis not present

## 2023-04-14 LAB — CBC
HCT: 36.5 % (ref 36.0–46.0)
Hemoglobin: 13.1 g/dL (ref 12.0–15.0)
MCH: 29.7 pg (ref 26.0–34.0)
MCHC: 35.9 g/dL (ref 30.0–36.0)
MCV: 82.8 fL (ref 80.0–100.0)
Platelets: 270 10*3/uL (ref 150–400)
RBC: 4.41 MIL/uL (ref 3.87–5.11)
RDW: 13.3 % (ref 11.5–15.5)
WBC: 6.3 10*3/uL (ref 4.0–10.5)
nRBC: 0 % (ref 0.0–0.2)

## 2023-04-14 LAB — BASIC METABOLIC PANEL
Anion gap: 7 (ref 5–15)
BUN: 11 mg/dL (ref 6–20)
CO2: 26 mmol/L (ref 22–32)
Calcium: 9.7 mg/dL (ref 8.9–10.3)
Chloride: 105 mmol/L (ref 98–111)
Creatinine, Ser: 0.69 mg/dL (ref 0.44–1.00)
GFR, Estimated: >60 mL/min (ref >60)
Glucose, Bld: 104 mg/dL — ABNORMAL HIGH (ref 70–99)
Potassium: 3.7 mmol/L (ref 3.5–5.1)
Sodium: 138 mmol/L (ref 135–145)

## 2023-04-14 LAB — D-DIMER, QUANTITATIVE: D-Dimer, Quant: <0.27 ug{FEU}/mL (ref 0.00–0.50)

## 2023-04-14 LAB — TROPONIN I (HIGH SENSITIVITY): Troponin I (High Sensitivity): <2 ng/L (ref <18)

## 2023-04-14 LAB — PREGNANCY, URINE: Preg Test, Ur: NEGATIVE

## 2023-04-14 NOTE — ED Notes (Signed)
Pt aware of the need for a urine... Pt currently unable to provide the sample.Marland KitchenMarland Kitchen

## 2023-04-14 NOTE — ED Triage Notes (Signed)
Heaviness in chest and dizziness. Sob. "Fluttery" Off and on  "for a while" but worse today. Some nausea

## 2023-04-14 NOTE — ED Provider Notes (Signed)
[place] [facility] EMERGENCY DEPARTMENT AT [facility] Provider Note   CSN: [id number] Arrival date & time: 04/14/23  1706     History  Chief Complaint  Patient presents with   Chest Pain    Chloe Chavez is a 32 y.o. female, no pertinent past medical history, who presents to the ED secondary to having left leg cramping, and pain that happened this morning.  She states that is very painful, and is very concerned she may have a blood clot in her leg.  Denies any kind recent trauma, use of OCPs, recent surgery.  No redness or swelling of the leg.  Additionally states that she is very concerned, as for the last few months, she has had intermittent episodes of chest pain, and lightheadedness.  She states she feels like she is going back and forth, and things are in slow motion.  She has pressurized chest pain, that makes it hard to breathe, and is not alleviated or aggravated by anything.  Notes this been going on for couple months, but got worse today so she decided to come in.  Feels like she is going to pass out during these episodes, but lowered herself to the ground slowly.  States that she is more dizzy when she stands up quickly.  Home Medications Prior to Admission medications   Medication Sig Start Date End Date Taking? Authorizing Provider  folic acid (FOLVITE) 1 MG tablet Take 1 tablet (1 mg total) by mouth daily. Patient not taking: Reported on 06/09/2017 05/23/14   Erenest Blank, NP  ibuprofen (ADVIL) 800 MG tablet Take 1 tablet (800 mg total) by mouth every 8 (eight) hours as needed. 01/17/20   Tarry Kos, MD  Prenatal Vit-Fe Fumarate-FA (PRENATAL MULTIVITAMIN) TABS tablet Take 1 tablet by mouth daily at 12 noon.    [provider]      Allergies    Augmentin [amoxicillin-pot clavulanate], Ceclor [cefaclor], Penicillins, and Latex    Review of Systems   Review of Systems  Respiratory:  Negative for shortness of breath.   Cardiovascular:  Positive for chest  pain.    Physical Exam Updated Vital Signs BP 100/66   Pulse 74   Resp 13   LMP 03/28/2023   SpO2 100%  Physical Exam Vitals and nursing note reviewed.  Constitutional:      General: She is not in acute distress.    Appearance: She is well-developed.  HENT:     Head: Normocephalic and atraumatic.  Eyes:     Conjunctiva/sclera: Conjunctivae normal.  Cardiovascular:     Rate and Rhythm: Normal rate and regular rhythm.     Heart sounds: No murmur heard. Pulmonary:     Effort: Pulmonary effort is normal. No respiratory distress.     Breath sounds: Normal breath sounds.  Abdominal:     Palpations: Abdomen is soft.     Tenderness: There is no abdominal tenderness.  Musculoskeletal:        General: No swelling.     Cervical back: Neck supple.  Skin:    General: Skin is warm and dry.     Capillary Refill: Capillary refill takes less than 2 seconds.  Neurological:     Mental Status: She is alert.  Psychiatric:        Mood and Affect: Mood normal.     ED Results / Procedures / Treatments   Labs (all labs ordered are listed, but only abnormal results are displayed) Labs Reviewed  BASIC METABOLIC PANEL -  Abnormal; Notable for the following components:      Result Value   Glucose, Bld 104 (*)    All other components within normal limits  CBC  PREGNANCY, URINE  D-DIMER, QUANTITATIVE  TROPONIN I (HIGH SENSITIVITY)    EKG None  Radiology US Venous Img Lower Unilateral Left  Result Date: 04/14/2023 CLINICAL DATA:  Left lower extremity pain and cramping. Chest pain and shortness of breath. Concern for pulmonary edema. EXAM: Left LOWER EXTREMITY VENOUS DOPPLER ULTRASOUND TECHNIQUE: Gray-scale sonography with compression, as well as color and duplex ultrasound, were performed to evaluate the deep venous system(s) from the level of the common femoral vein through the popliteal and proximal calf veins. COMPARISON:  None Available. FINDINGS: VENOUS Normal compressibility of the  common femoral, superficial femoral, and popliteal veins, as well as the visualized calf veins. Visualized portions of profunda femoral vein and great saphenous vein unremarkable. No filling defects to suggest DVT on grayscale or color Doppler imaging. Doppler waveforms show normal direction of venous flow, normal respiratory plasticity and response to augmentation. Limited views of the contralateral common femoral vein are unremarkable. OTHER None. Limitations: none IMPRESSION: Negative. Electronically Signed   By: Elgie Collard M.D.   On: 04/14/2023 19:43   DG Chest 2 View  Result Date: 04/14/2023 CLINICAL DATA:  Chest pain. Heaviness in the chest and dizziness. Shortness of breath. Nausea. EXAM: CHEST - 2 VIEW COMPARISON:  None Available. FINDINGS: Normal heart size and pulmonary vascularity. No focal airspace disease or consolidation in the lungs. No blunting of costophrenic angles. No pneumothorax. Mediastinal contours appear intact. Mild thoracolumbar scoliosis convex towards the left. Degenerative changes in the spine and shoulders. IMPRESSION: No active cardiopulmonary disease. Electronically Signed   By: Burman Nieves M.D.   On: 04/14/2023 19:36    Procedures Procedures    Medications Ordered in ED Medications - No data to display  ED Course/ Medical Decision Making/ A&P             HEART Score: 0                    Medical Decision Making Patient is here for multiple complaints, including Pain, has been going on for 1 day, we will obtain an ultrasound, given the cramping of the pain, and her shortness of breath, chest pain.  Additionally we will obtain a dimer, troponins, chest x-ray for further evaluation.  She is overall well-appearing on exam, the dizziness and the chest pain has been going on for multiple months.  She is currently having it on exam.  EKG is reassuring.  Amount and/or Complexity of Data Reviewed Labs: ordered.    Details: Troponin within normal limits,  D-dimer less than 0.27 Radiology: ordered.    Details: Chest x-ray clear ECG/medicine tests:  Decision-making details documented in ED Course. Discussion of management or test interpretation with external provider(s): Discussed with patient, ultrasound is reassuring, negative for any DVT, chest x-ray is clear, troponin within normal limits.  Unknown etiology of the chest pain, dizziness.  Orthostatics are reassuring.  She will need to follow-up with her primary care doctor, and follow-up possibly get a heart monitor for further evaluation.  We discussed return precautions and she voiced understanding.  I believe that the pain in her left calf is likely secondary to muscle cramp.   Final Clinical Impression(s) / ED Diagnoses Final diagnoses:  Chest pain, unspecified type  Dizziness  Pain of left calf    Rx / DC Orders  ED Discharge Orders     None         Pete Pelt, [place] 04/14/23 2011    Ernie Avena, MD 04/15/23 [phone]

## 2023-04-14 NOTE — ED Notes (Signed)
Discharge paperwork given and verbally understood. 

## 2023-04-14 NOTE — Discharge Instructions (Addendum)
Your workup today was reassuring.  There is no evidence of a blood clot, and I believe that you likely just have a calf strain.  You may get dizzy when you stand up or have chest pain, secondary to heart arrhythmias, low blood pressure, anxiety, and multiple other sources.  Please follow-up with your primary care doctor for further evaluation as everything today on our exam is reassuring.  Make sure you are drinking lots of water and resting.  Sit on the side of the bed, if you get dizzy when you rise
